# Patient Record
Sex: Female | Born: 1955 | Race: White | Hispanic: No | Marital: Married | Smoking: Never smoker
Health system: Southern US, Community
[De-identification: ages and names within clinical notes are randomized; demographics above are authoritative.]

## PROBLEM LIST (undated history)

## (undated) DIAGNOSIS — N3281 Overactive bladder: Secondary | ICD-10-CM

## (undated) DIAGNOSIS — F419 Anxiety disorder, unspecified: Secondary | ICD-10-CM

## (undated) DIAGNOSIS — N8110 Cystocele, unspecified: Secondary | ICD-10-CM

## (undated) DIAGNOSIS — Z960 Presence of urogenital implants: Secondary | ICD-10-CM

## (undated) DIAGNOSIS — R0989 Other specified symptoms and signs involving the circulatory and respiratory systems: Secondary | ICD-10-CM

## (undated) DIAGNOSIS — G479 Sleep disorder, unspecified: Secondary | ICD-10-CM

## (undated) DIAGNOSIS — F32A Depression, unspecified: Secondary | ICD-10-CM

## (undated) DIAGNOSIS — K219 Gastro-esophageal reflux disease without esophagitis: Secondary | ICD-10-CM

## (undated) HISTORY — PX: BUNIONECTOMY: SHX129

## (undated) HISTORY — DX: Cystocele, unspecified: N81.10

## (undated) HISTORY — DX: Overactive bladder: N32.81

## (undated) HISTORY — PX: CHOLECYSTECTOMY: SHX55

## (undated) HISTORY — DX: Gastro-esophageal reflux disease without esophagitis: K21.9

## (undated) HISTORY — DX: Presence of urogenital implants: Z96.0

## (undated) HISTORY — DX: Sleep disorder, unspecified: G47.9

## (undated) HISTORY — DX: Depression, unspecified: F32.A

## (undated) HISTORY — DX: Other specified symptoms and signs involving the circulatory and respiratory systems: R09.89

## (undated) HISTORY — DX: Anxiety disorder, unspecified: F41.9

---

## 1998-11-24 ENCOUNTER — Other Ambulatory Visit: Admission: RE | Admit: 1998-11-24 | Discharge: 1998-11-24 | Payer: Self-pay | Admitting: Family Medicine

## 1999-10-05 ENCOUNTER — Encounter: Payer: Self-pay | Admitting: Family Medicine

## 1999-10-05 ENCOUNTER — Encounter: Admission: RE | Admit: 1999-10-05 | Discharge: 1999-10-05 | Payer: Self-pay | Admitting: Family Medicine

## 2000-03-14 ENCOUNTER — Other Ambulatory Visit: Admission: RE | Admit: 2000-03-14 | Discharge: 2000-03-14 | Payer: Self-pay | Admitting: Family Medicine

## 2000-04-11 ENCOUNTER — Encounter: Payer: Self-pay | Admitting: Family Medicine

## 2000-04-11 ENCOUNTER — Encounter: Admission: RE | Admit: 2000-04-11 | Discharge: 2000-04-11 | Payer: Self-pay | Admitting: Family Medicine

## 2000-08-18 ENCOUNTER — Other Ambulatory Visit: Admission: RE | Admit: 2000-08-18 | Discharge: 2000-08-18 | Payer: Self-pay | Admitting: Family Medicine

## 2001-05-15 ENCOUNTER — Encounter: Admission: RE | Admit: 2001-05-15 | Discharge: 2001-05-15 | Payer: Self-pay | Admitting: Family Medicine

## 2001-05-15 ENCOUNTER — Encounter: Payer: Self-pay | Admitting: Family Medicine

## 2002-05-24 ENCOUNTER — Encounter: Admission: RE | Admit: 2002-05-24 | Discharge: 2002-05-24 | Payer: Self-pay | Admitting: Family Medicine

## 2002-05-24 ENCOUNTER — Encounter: Payer: Self-pay | Admitting: Family Medicine

## 2002-05-24 ENCOUNTER — Other Ambulatory Visit: Admission: RE | Admit: 2002-05-24 | Discharge: 2002-05-24 | Payer: Self-pay | Admitting: Family Medicine

## 2003-06-03 ENCOUNTER — Encounter: Admission: RE | Admit: 2003-06-03 | Discharge: 2003-06-03 | Payer: Self-pay | Admitting: Family Medicine

## 2003-06-03 ENCOUNTER — Encounter: Payer: Self-pay | Admitting: Family Medicine

## 2003-06-24 ENCOUNTER — Other Ambulatory Visit: Admission: RE | Admit: 2003-06-24 | Discharge: 2003-06-24 | Payer: Self-pay | Admitting: Family Medicine

## 2004-07-16 ENCOUNTER — Ambulatory Visit: Payer: Self-pay | Admitting: Family Medicine

## 2004-07-16 ENCOUNTER — Other Ambulatory Visit: Admission: RE | Admit: 2004-07-16 | Discharge: 2004-07-16 | Payer: Self-pay | Admitting: Family Medicine

## 2004-08-13 ENCOUNTER — Encounter: Admission: RE | Admit: 2004-08-13 | Discharge: 2004-08-13 | Payer: Self-pay | Admitting: Family Medicine

## 2005-07-17 ENCOUNTER — Other Ambulatory Visit: Admission: RE | Admit: 2005-07-17 | Discharge: 2005-07-17 | Payer: Self-pay | Admitting: Family Medicine

## 2005-07-17 ENCOUNTER — Encounter: Payer: Self-pay | Admitting: Family Medicine

## 2005-07-17 ENCOUNTER — Ambulatory Visit: Payer: Self-pay | Admitting: Family Medicine

## 2005-09-02 ENCOUNTER — Encounter: Admission: RE | Admit: 2005-09-02 | Discharge: 2005-09-02 | Payer: Self-pay | Admitting: Family Medicine

## 2006-10-03 ENCOUNTER — Other Ambulatory Visit: Admission: RE | Admit: 2006-10-03 | Discharge: 2006-10-03 | Payer: Self-pay | Admitting: Family Medicine

## 2006-10-03 ENCOUNTER — Encounter: Payer: Self-pay | Admitting: Family Medicine

## 2006-10-03 ENCOUNTER — Ambulatory Visit: Payer: Self-pay | Admitting: Family Medicine

## 2006-10-03 LAB — CONVERTED CEMR LAB
ALT: 12 units/L (ref 0–35)
AST: 16 units/L (ref 0–37)
Albumin: 4.4 g/dL (ref 3.5–5.2)
BUN: 13 mg/dL (ref 6–23)
Basophils Absolute: 0 10*3/uL (ref 0.0–0.1)
CO2: 26 meq/L (ref 19–32)
Calcium: 9.8 mg/dL (ref 8.4–10.5)
Eosinophils Absolute: 0.3 10*3/uL (ref 0.0–0.7)
HCT: 39.8 % (ref 36.0–46.0)
Hemoglobin: 13 g/dL (ref 12.0–15.0)
Lymphs Abs: 1.6 10*3/uL (ref 0.7–3.3)
MCV: 91.5 fL (ref 78.0–100.0)
Monocytes Relative: 7 % (ref 3–11)
Neutro Abs: 3.6 10*3/uL (ref 1.7–7.7)
Pap Smear: NORMAL
RBC: 4.35 M/uL (ref 3.87–5.11)
RDW: 13.9 % (ref 11.5–14.0)
WBC: 5.9 10*3/uL (ref 4.0–10.5)

## 2006-10-06 ENCOUNTER — Encounter: Admission: RE | Admit: 2006-10-06 | Discharge: 2006-10-06 | Payer: Self-pay | Admitting: Family Medicine

## 2007-09-02 ENCOUNTER — Telehealth: Payer: Self-pay | Admitting: Family Medicine

## 2007-09-27 DIAGNOSIS — R0989 Other specified symptoms and signs involving the circulatory and respiratory systems: Secondary | ICD-10-CM

## 2007-09-27 HISTORY — DX: Other specified symptoms and signs involving the circulatory and respiratory systems: R09.89

## 2007-10-05 ENCOUNTER — Encounter: Payer: Self-pay | Admitting: Family Medicine

## 2007-10-05 DIAGNOSIS — N318 Other neuromuscular dysfunction of bladder: Secondary | ICD-10-CM

## 2007-10-05 DIAGNOSIS — Z8619 Personal history of other infectious and parasitic diseases: Secondary | ICD-10-CM

## 2007-10-05 DIAGNOSIS — R61 Generalized hyperhidrosis: Secondary | ICD-10-CM

## 2007-10-05 DIAGNOSIS — N6019 Diffuse cystic mastopathy of unspecified breast: Secondary | ICD-10-CM

## 2007-10-05 DIAGNOSIS — K219 Gastro-esophageal reflux disease without esophagitis: Secondary | ICD-10-CM | POA: Insufficient documentation

## 2007-10-06 ENCOUNTER — Ambulatory Visit: Payer: Self-pay | Admitting: Family Medicine

## 2007-10-06 ENCOUNTER — Encounter: Payer: Self-pay | Admitting: Family Medicine

## 2007-10-06 ENCOUNTER — Other Ambulatory Visit: Admission: RE | Admit: 2007-10-06 | Discharge: 2007-10-06 | Payer: Self-pay | Admitting: Family Medicine

## 2007-10-06 DIAGNOSIS — R0989 Other specified symptoms and signs involving the circulatory and respiratory systems: Secondary | ICD-10-CM | POA: Insufficient documentation

## 2007-10-08 LAB — CONVERTED CEMR LAB
ALT: 12 units/L (ref 0–35)
AST: 16 units/L (ref 0–37)
Albumin: 4.6 g/dL (ref 3.5–5.2)
Alkaline Phosphatase: 57 units/L (ref 39–117)
BUN: 12 mg/dL (ref 6–23)
Basophils Absolute: 0 10*3/uL (ref 0.0–0.1)
Basophils Relative: 1 % (ref 0–1)
Bilirubin, Direct: 0.1 mg/dL (ref 0.0–0.3)
CO2: 24 meq/L (ref 19–32)
Calcium: 9.8 mg/dL (ref 8.4–10.5)
Chloride: 105 meq/L (ref 96–112)
Cholesterol: 190 mg/dL (ref 0–200)
Creatinine, Ser: 0.95 mg/dL (ref 0.40–1.20)
Eosinophils Absolute: 0.2 10*3/uL (ref 0.0–0.7)
Eosinophils Relative: 3 % (ref 0–5)
Glucose, Bld: 97 mg/dL (ref 70–99)
HCT: 39.4 % (ref 36.0–46.0)
HDL: 62 mg/dL (ref >39)
Hemoglobin: 13.2 g/dL (ref 12.0–15.0)
Indirect Bilirubin: 0.5 mg/dL (ref 0.0–0.9)
LDL Cholesterol: 113 mg/dL — ABNORMAL HIGH (ref 0–99)
Lymphocytes Relative: 30 % (ref 12–46)
Lymphs Abs: 1.3 10*3/uL (ref 0.7–4.0)
MCHC: 33.5 g/dL (ref 30.0–36.0)
MCV: 90.4 fL (ref 78.0–100.0)
Monocytes Absolute: 0.4 10*3/uL (ref 0.1–1.0)
Monocytes Relative: 8 % (ref 3–12)
Neutro Abs: 2.5 10*3/uL (ref 1.7–7.7)
Neutrophils Relative %: 58 % (ref 43–77)
Platelets: 267 10*3/uL (ref 150–400)
Potassium: 5.1 meq/L (ref 3.5–5.3)
RBC: 4.36 M/uL (ref 3.87–5.11)
RDW: 13 % (ref 11.5–15.5)
Sodium: 139 meq/L (ref 135–145)
TSH: 2.211 microintl units/mL (ref 0.350–5.50)
Total Bilirubin: 0.6 mg/dL (ref 0.3–1.2)
Total CHOL/HDL Ratio: 3.1
Total Protein: 7.3 g/dL (ref 6.0–8.3)
Triglycerides: 73 mg/dL (ref <150)
VLDL: 15 mg/dL (ref 0–40)
WBC: 4.4 10*3/uL (ref 4.0–10.5)

## 2007-10-12 ENCOUNTER — Encounter (INDEPENDENT_AMBULATORY_CARE_PROVIDER_SITE_OTHER): Payer: Self-pay | Admitting: *Deleted

## 2007-10-12 LAB — CONVERTED CEMR LAB: Pap Smear: NORMAL

## 2007-10-13 ENCOUNTER — Ambulatory Visit: Payer: Self-pay

## 2007-10-13 ENCOUNTER — Encounter: Admission: RE | Admit: 2007-10-13 | Discharge: 2007-10-13 | Payer: Self-pay | Admitting: Family Medicine

## 2007-10-13 ENCOUNTER — Encounter: Payer: Self-pay | Admitting: Family Medicine

## 2007-10-15 ENCOUNTER — Ambulatory Visit: Payer: Self-pay | Admitting: Family Medicine

## 2007-10-19 ENCOUNTER — Encounter (INDEPENDENT_AMBULATORY_CARE_PROVIDER_SITE_OTHER): Payer: Self-pay | Admitting: *Deleted

## 2007-12-25 ENCOUNTER — Ambulatory Visit: Payer: Self-pay | Admitting: Family Medicine

## 2007-12-25 DIAGNOSIS — G479 Sleep disorder, unspecified: Secondary | ICD-10-CM | POA: Insufficient documentation

## 2007-12-25 LAB — CONVERTED CEMR LAB
Bilirubin Urine: NEGATIVE
Glucose, Urine, Semiquant: NEGATIVE
Ketones, urine, test strip: NEGATIVE
Nitrite: NEGATIVE
Specific Gravity, Urine: 1.025
Urobilinogen, UA: 0.2

## 2008-01-06 ENCOUNTER — Ambulatory Visit: Payer: Self-pay | Admitting: Family Medicine

## 2008-01-06 ENCOUNTER — Encounter: Payer: Self-pay | Admitting: Family Medicine

## 2008-04-26 ENCOUNTER — Ambulatory Visit: Payer: Self-pay | Admitting: Obstetrics & Gynecology

## 2008-05-16 ENCOUNTER — Ambulatory Visit: Payer: Self-pay | Admitting: Obstetrics & Gynecology

## 2008-05-20 ENCOUNTER — Telehealth: Payer: Self-pay | Admitting: Family Medicine

## 2008-10-25 ENCOUNTER — Encounter: Admission: RE | Admit: 2008-10-25 | Discharge: 2008-10-25 | Payer: Self-pay | Admitting: Family Medicine

## 2008-10-27 ENCOUNTER — Encounter (INDEPENDENT_AMBULATORY_CARE_PROVIDER_SITE_OTHER): Payer: Self-pay | Admitting: *Deleted

## 2008-11-15 ENCOUNTER — Ambulatory Visit: Payer: Self-pay | Admitting: Family Medicine

## 2008-11-15 DIAGNOSIS — N951 Menopausal and female climacteric states: Secondary | ICD-10-CM

## 2008-11-15 DIAGNOSIS — N814 Uterovaginal prolapse, unspecified: Secondary | ICD-10-CM | POA: Insufficient documentation

## 2008-11-17 LAB — CONVERTED CEMR LAB
AST: 18 units/L (ref 0–37)
Alkaline Phosphatase: 79 units/L (ref 39–117)
CO2: 29 meq/L (ref 19–32)
Calcium: 10 mg/dL (ref 8.4–10.5)
Glucose, Bld: 97 mg/dL (ref 70–99)
HDL: 59 mg/dL (ref >39)
Hemoglobin: 14.2 g/dL (ref 12.0–15.0)
LDL Cholesterol: 138 mg/dL — ABNORMAL HIGH (ref 0–99)
MCHC: 34.5 g/dL (ref 30.0–36.0)
Monocytes Relative: 9 % (ref 3–12)
Neutrophils Relative %: 53 % (ref 43–77)
Platelets: 249 10*3/uL (ref 150–400)
Potassium: 4.9 meq/L (ref 3.5–5.3)
RDW: 13 % (ref 11.5–15.5)
Sodium: 141 meq/L (ref 135–145)
Total CHOL/HDL Ratio: 3.6
Total Protein: 7.6 g/dL (ref 6.0–8.3)
Triglycerides: 90 mg/dL (ref <150)

## 2008-12-07 ENCOUNTER — Ambulatory Visit: Payer: Self-pay | Admitting: Family Medicine

## 2008-12-07 LAB — CONVERTED CEMR LAB: OCCULT 1: NEGATIVE

## 2008-12-08 ENCOUNTER — Encounter (INDEPENDENT_AMBULATORY_CARE_PROVIDER_SITE_OTHER): Payer: Self-pay | Admitting: *Deleted

## 2009-02-13 ENCOUNTER — Telehealth: Payer: Self-pay | Admitting: Family Medicine

## 2009-04-17 ENCOUNTER — Telehealth: Payer: Self-pay | Admitting: Family Medicine

## 2009-05-11 ENCOUNTER — Ambulatory Visit: Payer: Self-pay | Admitting: Family Medicine

## 2009-05-12 LAB — CONVERTED CEMR LAB
LDL Cholesterol: 132 mg/dL — ABNORMAL HIGH (ref 0–99)
Total CHOL/HDL Ratio: 4

## 2009-07-11 ENCOUNTER — Telehealth: Payer: Self-pay | Admitting: Family Medicine

## 2009-11-07 ENCOUNTER — Encounter: Admission: RE | Admit: 2009-11-07 | Discharge: 2009-11-07 | Payer: Self-pay | Admitting: Family Medicine

## 2009-11-07 LAB — HM MAMMOGRAPHY

## 2009-11-09 ENCOUNTER — Encounter: Payer: Self-pay | Admitting: Family Medicine

## 2009-11-20 ENCOUNTER — Ambulatory Visit: Payer: Self-pay | Admitting: Family Medicine

## 2009-11-20 ENCOUNTER — Telehealth: Payer: Self-pay | Admitting: Family Medicine

## 2009-11-22 LAB — CONVERTED CEMR LAB
Albumin: 4.7 g/dL (ref 3.5–5.2)
CO2: 27 meq/L (ref 19–32)
Eosinophils Relative: 5 % (ref 0–5)
HCT: 40.8 % (ref 36.0–46.0)
Indirect Bilirubin: 0.6 mg/dL (ref 0.0–0.9)
LDL Cholesterol: 152 mg/dL — ABNORMAL HIGH (ref 0–99)
Lymphocytes Relative: 35 % (ref 12–46)
Lymphs Abs: 1.5 10*3/uL (ref 0.7–4.0)
MCHC: 33.8 g/dL (ref 30.0–36.0)
MCV: 88.3 fL (ref 78.0–100.0)
Monocytes Absolute: 0.3 10*3/uL (ref 0.1–1.0)
Neutro Abs: 2.3 10*3/uL (ref 1.7–7.7)
Neutrophils Relative %: 53 % (ref 43–77)
Platelets: 234 10*3/uL (ref 150–400)
Potassium: 4.3 meq/L (ref 3.5–5.3)
RBC: 4.62 M/uL (ref 3.87–5.11)
Sodium: 140 meq/L (ref 135–145)
TSH: 2.008 microintl units/mL (ref 0.350–4.500)
Total Bilirubin: 0.7 mg/dL (ref 0.3–1.2)
Total Protein: 7.4 g/dL (ref 6.0–8.3)
VLDL: 15 mg/dL (ref 0–40)

## 2009-11-24 ENCOUNTER — Telehealth: Payer: Self-pay | Admitting: Family Medicine

## 2009-11-29 ENCOUNTER — Ambulatory Visit: Payer: Self-pay | Admitting: Family Medicine

## 2009-11-30 ENCOUNTER — Encounter (INDEPENDENT_AMBULATORY_CARE_PROVIDER_SITE_OTHER): Payer: Self-pay | Admitting: *Deleted

## 2009-11-30 LAB — CONVERTED CEMR LAB: Fecal Occult Bld: NEGATIVE

## 2009-12-18 ENCOUNTER — Telehealth: Payer: Self-pay | Admitting: Family Medicine

## 2009-12-19 ENCOUNTER — Ambulatory Visit: Payer: Self-pay | Admitting: Obstetrics & Gynecology

## 2010-01-30 ENCOUNTER — Ambulatory Visit: Payer: Self-pay | Admitting: Family Medicine

## 2010-01-30 DIAGNOSIS — E7800 Pure hypercholesterolemia, unspecified: Secondary | ICD-10-CM | POA: Insufficient documentation

## 2010-02-01 LAB — CONVERTED CEMR LAB
ALT: 11 units/L (ref 0–35)
AST: 17 units/L (ref 0–37)
HDL: 53 mg/dL (ref >39)
LDL Cholesterol: 107 mg/dL — ABNORMAL HIGH (ref 0–99)
Triglycerides: 94 mg/dL (ref <150)
VLDL: 19 mg/dL (ref 0–40)

## 2010-02-09 ENCOUNTER — Telehealth: Payer: Self-pay | Admitting: Family Medicine

## 2010-02-12 ENCOUNTER — Telehealth: Payer: Self-pay | Admitting: Family Medicine

## 2010-04-12 ENCOUNTER — Telehealth: Payer: Self-pay | Admitting: Family Medicine

## 2010-07-06 ENCOUNTER — Ambulatory Visit: Payer: Self-pay | Admitting: Family Medicine

## 2010-07-06 DIAGNOSIS — R519 Headache, unspecified: Secondary | ICD-10-CM | POA: Insufficient documentation

## 2010-07-06 DIAGNOSIS — R51 Headache: Secondary | ICD-10-CM | POA: Insufficient documentation

## 2010-07-11 ENCOUNTER — Telehealth: Payer: Self-pay | Admitting: Family Medicine

## 2010-09-25 NOTE — Progress Notes (Signed)
Summary: pt wants ambien  Phone Note Refill Request Call back at [facility] Phone [phone] Message from:  Patient  Refills Requested: Medication #1:  ambien 10 mg Pt has been trying different sleep meds.  She has most recently been on lunesta, but she doesnt like this- gives her strange dreams.  She wants to go back to generic ambien.  [facility]  Initial call taken by: Lowella Petties CMA,  February 09, 2010 9:01 AM

## 2010-09-25 NOTE — Progress Notes (Signed)
Summary: regarding ambien  Phone Note Call from Patient   Caller: Patient Call For: Dr. Dayton Martes Summary of Call: Pt had called on friday requesting ambien.  I must have signed off on the phone note instead of sending it out.  She has been trying different sleep meds and wants to go back to generic ambien.  The lunesta, which she had last, gave her strange dreams.  [facility] [address]  [phone]. Initial call taken by: Lowella Petties CMA,  February 12, 2010 9:30 AM  Follow-up for Phone Call        [facility] Follow-up by: Lowella Petties CMA,  February 13, 2010 1:59 PM    New/Updated Medications: ZOLPIDEM TARTRATE 10 MG TABS (ZOLPIDEM TARTRATE) one tab by mouth at night if needed for insomnia Prescriptions: ZOLPIDEM TARTRATE 10 MG TABS (ZOLPIDEM TARTRATE) one tab by mouth at night if needed for insomnia  #30 x 0   Entered and Authorized by:   Shaune Leeks MD   Signed by:   Shaune Leeks MD on 02/13/2010   Method used:   Telephoned to ...       [facility] [phone]* (retail)       [address] [place], [place]  [address]       Ph: [phone] or [phone]       Fax: [phone]   RxID:   [id number]

## 2010-09-25 NOTE — Letter (Signed)
Summary: Results Follow up Letter  [place] at [facility]  [address] [place], [place] [address]   Phone: [phone]  Fax: [phone]    11/09/2009 MRN: [id number]    LYNDALL BELLOT [address] [place], [place]  [address]    Dear [name],  The following are the results of your recent test(s):  Test         Result    Pap Smear:        Normal _____  Not Normal _____ Comments: ______________________________________________________ Cholesterol: LDL(Bad cholesterol):         Your goal is less than:         HDL (Good cholesterol):       Your goal is more than: Comments:  ______________________________________________________ Mammogram:        Normal __X___  Not Normal _____ Comments:Please repeat in one year.  ___________________________________________________________________ Hemoccult:        Normal _____  Not normal _______ Comments:    _____________________________________________________________________ Other Tests:    We routinely do not discuss normal results over the telephone.  If you desire a copy of the results, or you have any questions about this information we can discuss them at your next office visit.   Sincerely,     Roxy Manns, MD  MT/ri

## 2010-09-25 NOTE — Assessment & Plan Note (Signed)
Summary: CPX/CLE   Vital Signs:  Patient profile:   55 year old female Height:      61.5 inches Weight:      121.50 pounds BMI:     22.67 Temp:     98.5 degrees F oral Pulse rate:   68 / minute Pulse rhythm:   regular BP sitting:   108 / 70  (left arm) Cuff size:   regular  Vitals Entered By: Lewanda Rife LPN (November 20, 2009 9:29 AM) CC: complete physical (appt with GYN in April)   History of Present Illness: here for wellness exam and to disc chronic problems   is doing ok in general  a lot of changes in her body with menopause   is still having trouble sleeping -- takes [place] but not every night - takes it only if needed  4-5 hours of sleep then  this affects her daytime performance    wt is down 4 lb with good bmi of 22  is really working with her diet -- and proud of this  is also exercising (took a break for foot surgery) walks on the treadmill 4 times per week    bp 108/70 today   gyn appt upcoming in april  has a pessary  mam nl 3/11 self exam - no lumps or changes    colon screen-- is not interested in one . would rather do stool test  may consider colonosc at 56   lipids in fall Last Lipid ProfileCholesterol: 196 (05/11/2009 10:50:44 AM)HDL:  49.60 (05/11/2009 10:50:44 AM)LDL:  132 (05/11/2009 10:50:44 AM)Triglycerides:  Last Liver profileSGOT:  18 (05/11/2009 10:50:44 AM)SPGT:  14 (05/11/2009 10:50:44 AM)T. Bili:  0.8 (11/15/2008 9:09:00 PM)Alk Phos:  79 (11/15/2008 9:09:00 PM)  DIET IS GOOD-- thinks this will have imp  tD 06    Allergies: 1)  ! Sulfa 2)  ! 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 Eyes:  Denies blurring and eye irritation. CV:  Denies chest pain or discomfort, palpitations, and shortness of breath with exertion. Resp:  Denies cough and wheezing. GI:  Denies abdominal pain, bloody stools, change in bowel habits, indigestion, and nausea. GU:  Denies abnormal vaginal bleeding, discharge, and dysuria. MS:  Denies joint redness, joint swelling, and cramps. Derm:  Denies itching, lesion(s), poor wound healing, and rash. Neuro:  Denies headaches, numbness, and tingling. Psych:  mood is ok . Endo:  Denies cold intolerance, excessive thirst, excessive urination, and heat intolerance. Heme:  Denies abnormal bruising and bleeding.  Physical Exam  General:  Well-developed,well-nourished,in no acute distress; alert,appropriate and cooperative throughout examination Head:  normocephalic, atraumatic, and no abnormalities observed.   Eyes:  vision grossly intact, pupils equal, pupils round, and pupils reactive to light.  no conjunctival pallor, injection or icterus  Ears:  R ear normal and L ear normal.   Nose:  no nasal discharge.   Mouth:  pharynx pink and moist.   Neck:  baseline L carotid  bruit supple, no M or thyromegally Chest Wall:  No deformities, masses, or tenderness noted. Lungs:  Normal respiratory effort, chest expands symmetrically. Lungs are clear to auscultation, no crackles or wheezes. Heart:  Normal rate and regular rhythm. S1 and S2 normal without gallop, murmur, click, rub or other extra sounds. Abdomen:  Bowel sounds positive,abdomen soft and non-tender without masses, organomegaly or hernias noted. no renal bruits  Msk:  No deformity or scoliosis noted of thoracic or lumbar spine.   no acute joint changes  Pulses:  R and L carotid,radial,femoral,dorsalis pedis and posterior tibial pulses are full and equal bilaterally Extremities:  No clubbing, cyanosis, edema, or deformity noted with normal full range of motion of all joints.   Neurologic:  sensation intact to light touch, gait normal, and DTRs symmetrical and normal.   Skin:  Intact without suspicious lesions or rashes few small SK on back and lentigos Cervical Nodes:  No lymphadenopathy noted Inguinal Nodes:  No significant adenopathy Psych:  normal affect, talkative and pleasant    Impression & Recommendations:  Problem # 1:  HEALTH MAINTENANCE EXAM (ICD-V70.0) Assessment Comment Only reviewed health habits including diet, exercise and skin cancer prevention reviewed health maintenance list and family history lab today  suspect cholesterol will be imp with better diet  Td up to date stool card for screen - declines colonosc  f/u gyn as planned  Orders: Venipuncture (29798) Specimen Handling (92119)  Problem # 2:  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 Daily per gyn 2)  Vagifem 10 Mcg Tabs (Estradiol) .... One tab vaginally twice weekly 3)  Multivitamins Tabs (Multiple vitamin) .... Take 1 tablet by mouth once a day 4)  Viactiv Multi-vitamin Chew (Multiple vitamins-calcium) .... Chew two daily 5)  Fish Oil Oil (Fish oil) .... Takes 1000mg  daily 6)  Ambien Cr 6.25 Mg Cr-tabs (Zolpidem tartrate) .Marland Kitchen.. 1 by mouth once daily  Patient Instructions: 1)  keep up the good work with diet and exercise  2)  labs today  3)  try the ambien cr and let me know how it works and  what you want to refil in month  4)  please do stool test  5)  the current recommendation for calcium intake is 1200-1500 mg daily with 1000 IU of vitamin D  Prescriptions: AMBIEN CR 6.25 MG CR-TABS (ZOLPIDEM TARTRATE) 1 by mouth once daily  #30 x 0   Entered and Authorized by:   Judith Part MD   Signed by:   Judith Part MD on 11/20/2009   Method used:   Print then Give to Patient   RxID:   [id number]   Current Allergies (reviewed today): ! SULFA ! CODEINE

## 2010-09-25 NOTE — Letter (Signed)
Summary: Results Follow up Letter  [place] at [facility]  [address] [place], [place] [address]   Phone: [phone]  Fax: [phone]    11/30/2009 MRN: [id number]    Katherine Macdonald [address] [place], [place]  [address]    Dear [name],  The following are the results of your recent test(s):  Test         Result    Pap Smear:        Normal _____  Not Normal _____ Comments: ______________________________________________________ Cholesterol: LDL(Bad cholesterol):         Your goal is less than:         HDL (Good cholesterol):       Your goal is more than: Comments:  ______________________________________________________ Mammogram:        Normal _____  Not Normal _____ Comments:  ___________________________________________________________________ Hemoccult:        Normal __X___  Not normal _______ Comments:    _____________________________________________________________________ Other Tests:    We routinely do not discuss normal results over the telephone.  If you desire a copy of the results, or you have any questions about this information we can discuss them at your next office visit.   Sincerely,    Marne A. Milinda Antis, M.D.  MAT:lsf

## 2010-09-25 NOTE — Miscellaneous (Signed)
Summary: mammogram screening   Clinical Lists Changes  Observations: Added new observation of MAMMO DUE: 10/2010 (11/09/2009 15:31) Added new observation of MAMMOGRAM: normal (11/07/2009 15:31)      Preventive Care Screening  Mammogram:    Date:  11/07/2009    Next Due:  10/2010    Results:  normal

## 2010-09-25 NOTE — Progress Notes (Signed)
Summary: silenor is not helping sleep  Phone Note Call from Patient Call back at [phone]   Caller: Patient Call For: Judith Part MD Summary of Call: Pt was seen last week for headaches thought to be caused by ambien.  She stopped ambien and started silenor.  The headaches stopped, she hasnt had to take anything for those since saturday,  but the silenor is not working, she is not sleeping.  What else can she try?  [facility] Initial call taken by: Lowella Petties CMA, AAMA,  July 11, 2010 9:59 AM  Follow-up for Phone Call        I hate to give her anything habit forming - but I think that is our last resort lets try low dose of klonopin - let her know to use caution for sedation and this is habit forming - also do not loose px/ etc  this will generally help people stay asleep let me know how it works Follow-up by: Judith Part MD,  July 11, 2010 11:29 AM  Additional Follow-up for Phone Call Additional follow up Details #1::        lEFT MESSAGE FOR PATIENT BUT, RX ALREADY CALLED TO PHARMACY.Consuello Masse CMA   Additional Follow-up by: Benny Lennert CMA Duncan Dull),  July 11, 2010 11:58 AM   New Allergies: Rush [place] Additional Follow-up for Phone Call Additional follow up Details #2::    Advised pt[facility] Follow-up by: Lowella Petties CMA, AAMA,  July 11, 2010 12:08 PM  New/Updated Medications: KLONOPIN 0.5 MG TABS (CLONAZEPAM) 1 by mouth at bedtime as needed insomnia New Allergies: * SILENOR0Prescriptions: KLONOPIN 0.5 MG TABS (CLONAZEPAM) 1 by mouth at bedtime as needed insomnia  #30 x 1   Entered and Authorized by:   Judith Part MD   Signed by:   Lowella Petties CMA, AAMA on 07/11/2010   Method used:   Telephoned to ...       [facility] [phone]* (retail)       [address] [place], [place]  [address]       Ph: [phone] or [phone]       Fax: [phone]   RxID:    [id number]

## 2010-09-25 NOTE — Progress Notes (Signed)
Summary: refill request for ambien  Phone Note Refill Request Message from:  Fax from Pharmacy  Refills Requested: Medication #1:  ZOLPIDEM TARTRATE 10 MG TABS one tab by mouth at night if needed for insomnia.   Last Refilled: 02/13/2010 [facility] [address], [phone]  Initial call taken by: Lowella Petties CMA,  April 12, 2010 8:39 AM  Follow-up for Phone Call        px written on EMR for call in  Follow-up by: Judith Part MD,  April 12, 2010 8:48 AM  Additional Follow-up for Phone Call Additional follow up Details #1::        [facility] as instructed. Lewanda Rife LPN  April 12, 2010 9:01 AM     Prescriptions: ZOLPIDEM TARTRATE 10 MG TABS (ZOLPIDEM TARTRATE) one tab by mouth at night if needed for insomnia  #30 x 2   Entered and Authorized by:   Judith Part MD   Signed by:   Lewanda Rife LPN on 16/05/9603   Method used:   Telephoned to ...       [facility] [phone]* (retail)       [address] [place], [place]  [address]       Ph: [phone] or [phone]       Fax: [phone]   RxID:   [id number]

## 2010-09-25 NOTE — Assessment & Plan Note (Signed)
Summary: FREQUENT HEADACHES/CLE   Vital Signs:  Patient profile:   55 year old female Height:      61.5 inches Weight:      117.50 pounds BMI:     21.92 Temp:     98.4 degrees F oral Pulse rate:   64 / minute Pulse rhythm:   regular BP sitting:   112 / 72  (left arm) Cuff size:   regular  Vitals Entered By: Lewanda Rife LPN (July 06, 2010 9:00 AM) CC: frequent h/as   History of Present Illness: having headaches more frequent than normal  over the last 5-6 weeks -- having headaches 3-4 times per week  fairly bad but not activity limiting   no light or sound sensitivity  went for massage -- because neck hurts too -- that did not help   took ibuprofen -- initially helped and that did not work  excedrin migraine helped initially   wondered about [place] -- ? if worse when she takes or does not take  tends to take 1/2 tab 5-6 times per week   headaches are bilateral not throbbing  not worse with exertion  waves of nausea - not severe  worst on pain scale is 7-8   is drinking a lot of water  drinks coffee in am and tea  at lunch   is right in middle of menopause   Allergies: 1)  ! Sulfa 2)  ! 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 Eyes:  Denies blurring and eye irritation. CV:  Denies chest pain or discomfort, lightheadness, palpitations, and  shortness of breath with exertion. Resp:  Denies cough, shortness of breath, and wheezing. GI:  Denies abdominal pain, change in bowel habits, indigestion, nausea, and vomiting. GU:  Denies urinary frequency. MS:  Complains of stiffness and thoracic pain; neck is sore and tight at times - esp when stressed . Derm:  Denies lesion(s), poor wound healing, and rash. Neuro:  Complains of headaches; denies difficulty with concentration, disturbances in coordination, falling down, numbness, tingling, visual disturbances, and weakness. Psych:  Denies anxiety and depression. Endo:  Denies cold intolerance, excessive thirst, excessive urination, and heat intolerance. Heme:  Denies abnormal bruising and bleeding.  Physical Exam  General:  slim and well appearing  Head:  normocephalic, atraumatic, and no abnormalities observed.   Eyes:  vision grossly intact, pupils equal, pupils round, and pupils reactive to light.  no conjunctival pallor, injection or icterus  Mouth:  pharynx pink and moist.   Neck:  soft bruit on L  otherwise nl  nl rom, no thyromeg or jvd Chest Wall:  No deformities, masses, or tenderness noted. Lungs:  Normal respiratory effort, chest expands symmetrically. Lungs are clear to auscultation, no crackles or wheezes. Heart:  Normal rate and regular rhythm. S1 and S2 normal without gallop, murmur, click, rub or other extra sounds. Abdomen:  Bowel sounds positive,abdomen soft and non-tender without masses, organomegaly or hernias noted. Msk:  generally tight neck muscles with nl rom and no bony tenderness Extremities:  No clubbing, cyanosis, edema, or deformity noted with normal full range of motion of all joints.   Neurologic:  alert & oriented X3, cranial nerves II-XII intact, strength normal in all extremities, sensation intact to light touch, gait normal, DTRs symmetrical and normal, toes down bilaterally on Babinski, and Romberg negative.   Skin:  Intact without suspicious lesions  or rashes Cervical Nodes:  No lymphadenopathy noted Inguinal Nodes:  No significant adenopathy Psych:  normal affect, talkative and pleasant    Impression & Recommendations:  Problem # 1:  HEADACHE (ICD-784.0) Assessment New ongoing - almost daily no migraine features suspect may be side eff from [place] will try 6 mg silenor for sleep- samples disc lifestyle habits/ sleep hygiene in detail  bed and wake time same each day and good fluids/ avoid caff disc phenomenon of analgesic rebound  will use ibuprofen sparingly   Problem # 2:  SLEEP DISORDER (ICD-780.50) Assessment: Unchanged see above   Complete Medication List: 1)  Pessary  .... Daily per gyn 2)  Vagifem 10 Mcg Tabs (Estradiol) .... One tab vaginally twice weekly 3)  Multivitamins Tabs (Multiple vitamin) .... Take 1 tablet by mouth once a day 4)  Viactiv Multi-vitamin Chew (Multiple vitamins-calcium) .... Chew two daily 5)  Flax Oil (Flaxseed (linseed)) .... Two tsp on food four times a week 6)  Silenor 6 Mg Tabs (Doxepin hcl) .Marland Kitchen.. 1 by mouth at bedtime as needed  Patient Instructions: 1)  try the silenor for sleep 6 mg at bedtime 2)  let me know if your headaches do not improve in 1-2 wk or if this does not help 3)  take ibupfrofen if you need to    Orders Added: 1)  Est. Patient Level IV [94854]    Current Allergies (reviewed today): ! SULFA ! CODEINE * AMBIEN

## 2010-09-25 NOTE — Progress Notes (Signed)
Summary: Lunesta   Phone Note Call from Patient Call back at Home Phone [phone]   Caller: Patient Call For: Judith Part MD Summary of Call: Patient was given rx for ambien at her cpx and it was denied.( I saw phone note regarding that) Patient wants to know if she needs to try Lunesta or what she needs to do. [facility] [address] Please advise. Initial call taken by: Melody Comas,  December 18, 2009 2:52 PM  Follow-up for Phone Call        px written on EMR for call in  Follow-up by: Judith Part MD,  December 18, 2009 3:58 PM  Additional Follow-up for Phone Call Additional follow up Details #1::        Patient notified as instructed by telephone. [facility] as instructed. Lewanda Rife LPN  December 18, 2009 4:54 PM     New/Updated Medications: LUNESTA 2 MG TABS (ESZOPICLONE) 1 by mouth at bedtime as needed Prescriptions: LUNESTA 2 MG TABS (ESZOPICLONE) 1 by mouth at bedtime as needed  #30 x 1   Entered and Authorized by:   Judith Part MD   Signed by:   Lewanda Rife LPN on 09/81/1914   Method used:   Telephoned to ...         RxID:   [id number]

## 2010-09-25 NOTE — Letter (Signed)
Summary: [facility]: Immunoassay Fecal Occult Blood (iFOB) Order Form  [place] at [facility]  [address] [place], [place] [address]   Phone: [phone]  Fax: [phone]      [facility]: Immunoassay Fecal Occult Blood (iFOB) Order Form   November 20, 2009 MRN: [id number]   Katherine Macdonald 25-Oct-1955   Physicican Name:______Tower___________________  Diagnosis Code:_______V76.49___________________      Judith Part MD

## 2010-09-25 NOTE — Progress Notes (Signed)
Summary: prior auth for [employer] cr denied  Phone Note Other Incoming   Caller: [employer] of Call: Prior auth for [employer] cr denied, pt needs to try lunesta.  Letter is on your shelf. Initial call taken by: Lowella Petties CMA,  November 24, 2009 3:10 PM  Follow-up for Phone Call        I did fail Remus Loffler / zolpidem -- I indicated this on her letter - please fax back and see what they say if still denied - will try the lunesta  Follow-up by: Judith Part MD,  November 24, 2009 3:44 PM  Additional Follow-up for Phone Call Additional follow up Details #1::        Info faxed as requested on 11/24/09 to [phone]. Lewanda Rife LPN  November 27, 1025 8:23 AM

## 2010-09-25 NOTE — Progress Notes (Signed)
Summary: Prior Authorization Zolpidem  Phone Note From Pharmacy Call back at ph [phone] fax [phone]   [facility]  Call For: Dr. Milinda Antis  Summary of Call: Received fax from pharmacy stating that PA is needed for Zolpidem.  Called [facility] at [phone] and they will fax form to our office today.  Linde Gillis CMA Duncan Dull)  November 20, 2009 3:13 PM   PA form in your IN box. Initial call taken by: Linde Gillis CMA Duncan Dull),  November 20, 2009 4:46 PM  Follow-up for Phone Call        her ins co wants to know what other meds she has tried for sleep that have not worked -- I need to list them on her form  let me know- I will hold form Follow-up by: Judith Part MD,  November 20, 2009 5:24 PM  Additional Follow-up for Phone Call Additional follow up Details #1::        Left message for patient to call back. Lewanda Rife LPN  November 21, 2009 9:30 AM  Pt has tried plain ambien 10 mg's and melatonin.  Also some OTC sleep   thanks form done and in nurse in box  - MT  Additional Follow-up by: Lowella Petties CMA,  November 21, 2009 12:20 PM    Additional Follow-up for Phone Call Additional follow up Details #2::    Form faxed to [facility] [phone] Follow-up by: Linde Gillis CMA Duncan Dull),  November 21, 2009 2:38 PM

## 2010-10-02 ENCOUNTER — Telehealth: Payer: Self-pay | Admitting: Family Medicine

## 2010-10-04 ENCOUNTER — Encounter: Payer: Self-pay | Admitting: Family Medicine

## 2010-10-11 NOTE — Progress Notes (Signed)
Summary: refill request for klonopin  Phone Note Refill Request Message from:  Fax from Pharmacy  Refills Requested: Medication #1:  KLONOPIN 0.5 MG TABS 1 by mouth at bedtime as needed insomnia.   Last Refilled: 08/22/2010 [facility] [address], [phone]  Initial call taken by: Lowella Petties CMA, AAMA,  October 02, 2010 8:08 AM  Follow-up for Phone Call        [facility] [address] [facility] as instructed. Lewanda Rife LPN  October 02, 2010 10:11 AM     Prescriptions: KLONOPIN 0.5 MG TABS (CLONAZEPAM) 1 by mouth at bedtime as needed insomnia  #30 x 1   Entered and Authorized by:   Judith Part MD   Signed by:   Judith Part MD on 10/02/2010   Method used:   Telephoned to ...       [facility] [phone]* (retail)       [address] [place], [place]  [address]       Ph: [phone] or [phone]       Fax: [phone]   RxID:   [id number]

## 2010-10-16 ENCOUNTER — Other Ambulatory Visit: Payer: Self-pay | Admitting: Family Medicine

## 2010-10-16 DIAGNOSIS — Z1231 Encounter for screening mammogram for malignant neoplasm of breast: Secondary | ICD-10-CM

## 2010-11-20 ENCOUNTER — Telehealth: Payer: Self-pay | Admitting: Family Medicine

## 2010-11-20 DIAGNOSIS — E7800 Pure hypercholesterolemia, unspecified: Secondary | ICD-10-CM

## 2010-11-20 DIAGNOSIS — Z0000 Encounter for general adult medical examination without abnormal findings: Secondary | ICD-10-CM

## 2010-11-20 NOTE — Telephone Encounter (Signed)
Message copied by Roxy Manns on Tue Nov 20, 2010  1:34 PM ------      Message from: Mills Koller      Created: Tue Nov 20, 2010 12:53 PM       CPX orders please, Thanks, Camelia Eng

## 2010-11-21 ENCOUNTER — Other Ambulatory Visit (INDEPENDENT_AMBULATORY_CARE_PROVIDER_SITE_OTHER): Payer: PRIVATE HEALTH INSURANCE | Admitting: Family Medicine

## 2010-11-21 DIAGNOSIS — E7800 Pure hypercholesterolemia, unspecified: Secondary | ICD-10-CM

## 2010-11-21 DIAGNOSIS — Z0000 Encounter for general adult medical examination without abnormal findings: Secondary | ICD-10-CM

## 2010-11-21 LAB — CBC WITH DIFFERENTIAL/PLATELET
Basophils Relative: 1 % (ref 0–1)
HCT: 41.8 % (ref 36.0–46.0)
Lymphocytes Relative: 38 % (ref 12–46)
Lymphs Abs: 1.5 10*3/uL (ref 0.7–4.0)
MCH: 30.4 pg (ref 26.0–34.0)
Monocytes Relative: 9 % (ref 3–12)
Neutro Abs: 1.8 10*3/uL (ref 1.7–7.7)
RBC: 4.6 MIL/uL (ref 3.87–5.11)
RDW: 13.5 % (ref 11.5–15.5)

## 2010-11-21 LAB — COMPREHENSIVE METABOLIC PANEL
ALT: 17 U/L (ref 0–35)
Albumin: 4.6 g/dL (ref 3.5–5.2)
Glucose, Bld: 94 mg/dL (ref 70–99)
Potassium: 5 mEq/L (ref 3.5–5.3)
Total Bilirubin: 0.8 mg/dL (ref 0.3–1.2)

## 2010-11-21 NOTE — Telephone Encounter (Addendum)
Addended byMills Koller on: 11/21/2010 09:12 AM  Modules accepted: Orders Orders cancelled

## 2010-11-21 NOTE — Telephone Encounter (Signed)
Addended byMills Koller on: 11/21/2010 10:11 AM   Modules accepted: Orders

## 2010-11-22 LAB — TSH: TSH: 3.252 u[IU]/mL (ref 0.350–4.500)

## 2010-11-23 ENCOUNTER — Ambulatory Visit
Admission: RE | Admit: 2010-11-23 | Discharge: 2010-11-23 | Disposition: A | Discharge status: Home / Self Care | Payer: PRIVATE HEALTH INSURANCE | Source: Ambulatory Visit | Attending: Family Medicine | Admitting: Family Medicine

## 2010-11-23 DIAGNOSIS — Z1231 Encounter for screening mammogram for malignant neoplasm of breast: Secondary | ICD-10-CM

## 2010-11-23 LAB — HM MAMMOGRAPHY: HM Mammogram: NORMAL

## 2010-11-27 ENCOUNTER — Ambulatory Visit (INDEPENDENT_AMBULATORY_CARE_PROVIDER_SITE_OTHER): Payer: PRIVATE HEALTH INSURANCE | Admitting: Family Medicine

## 2010-11-27 ENCOUNTER — Encounter: Payer: Self-pay | Admitting: Family Medicine

## 2010-11-27 DIAGNOSIS — E7800 Pure hypercholesterolemia, unspecified: Secondary | ICD-10-CM

## 2010-11-27 DIAGNOSIS — Z0000 Encounter for general adult medical examination without abnormal findings: Secondary | ICD-10-CM

## 2010-11-27 DIAGNOSIS — G479 Sleep disorder, unspecified: Secondary | ICD-10-CM

## 2010-11-27 DIAGNOSIS — Z1211 Encounter for screening for malignant neoplasm of colon: Secondary | ICD-10-CM

## 2010-11-27 NOTE — Assessment & Plan Note (Signed)
Still frustrated but klonopin has helped  Continue this and exercise Offered ref to Dr Catalina Lunger in future for sleep if she wants it Good sleep hygiene

## 2010-11-27 NOTE — Assessment & Plan Note (Signed)
Reviewed health habits including diet and exercise and skin cancer prevention Also reviewed health mt list, fam hx and immunizations   Rev wellness labs  

## 2010-11-27 NOTE — Assessment & Plan Note (Signed)
IFOB today No hx of problems No bowel changes Pt will be open to colonosc at 56

## 2010-11-27 NOTE — Progress Notes (Signed)
  Subjective:    Patient ID: Katherine Macdonald, female    DOB: 1955/10/16, 55 y.o.   MRN: [id number]  HPI Here for health mt visit and to review chronic med problems  Is doing ok overall - not a lot of complaints  Sleep issues are still her biggest problem  klonazepam helps some -- and not as rested as she needs to  Now is getting 5 hours of sleep - wakes up and sometimes goes back to sleep  Thinks she needs 6-7 -- is interested in inc dose to 11/2  Does not notice a lot of difference  Does not want to go up to 1 mg   Good sleep hygiene and she exercises regularly      Sees gyn - this month upcoming  Pap -- last April  Pessary--Dr Nicholaus Bloom  Needs breast exam -- wants to do that today  Self exam-no lumps  Mam 3/12-- normal   Td 06  ? dexa  Lipids Lab Results  Component Value Date   CHOL 206* 11/21/2010   CHOL 179 01/30/2010   CHOL 223* 11/20/2009   Lab Results  Component Value Date   HDL 56 11/21/2010   HDL 53 04/26/4781   HDL 56 11/20/2009   Lab Results  Component Value Date   LDLCALC 134* 11/21/2010   LDLCALC 107* 01/30/2010   LDLCALC 152* 11/20/2009   Lab Results  Component Value Date   TRIG 78 11/21/2010   TRIG 94 01/30/2010   TRIG 75 11/20/2009   Lab Results  Component Value Date   CHOLHDL 3.7 11/21/2010   CHOLHDL 3.4 Ratio 01/30/2010   CHOLHDL 4.0 Ratio 11/20/2009   LDL is up from 107 to 134 this draw Is eating healthy -- following low sat fat diet    Colon screen=is doing heme test instead of colonosc -until 56    Wt is down 1 lb  Great bp of 98/68      Review of Systems  Musculoskeletal: Positive for back pain.       Occ lower back/ buttock pain  May call for ortho appt in future Ice helps  Saw chiropractor       Objective:   Physical Exam  Genitourinary: No breast swelling, tenderness, discharge or bleeding.          Assessment & Plan:

## 2010-11-27 NOTE — Assessment & Plan Note (Signed)
Cholesterol is up a bit Rev low sat fat diet Excellent habits Continue to monitor LDL 130 or lower is goal Rev labs in detail

## 2010-11-27 NOTE — Patient Instructions (Signed)
Keep up the good work with health habits  No change in medicines  Keep working on low cholesterol diet (Avoid red meat/ fried foods/ egg yolks/ fatty breakfast meats/ butter, cheese and high fat dairy/ and shellfish  ) Do stool card for colon cancer screening

## 2010-12-11 ENCOUNTER — Other Ambulatory Visit: Payer: Self-pay | Admitting: *Deleted

## 2010-12-11 MED ORDER — CLONAZEPAM 0.5 MG PO TABS: ORAL_TABLET | ORAL | Status: DC

## 2010-12-11 NOTE — Telephone Encounter (Signed)
Px written for call in   

## 2010-12-11 NOTE — Telephone Encounter (Signed)
Patient notified as instructed by telephone. [facility] [address] as instructed.

## 2010-12-18 ENCOUNTER — Other Ambulatory Visit (INDEPENDENT_AMBULATORY_CARE_PROVIDER_SITE_OTHER): Payer: PRIVATE HEALTH INSURANCE | Admitting: Family Medicine

## 2010-12-18 ENCOUNTER — Other Ambulatory Visit: Payer: PRIVATE HEALTH INSURANCE

## 2010-12-18 DIAGNOSIS — Z1211 Encounter for screening for malignant neoplasm of colon: Secondary | ICD-10-CM

## 2010-12-18 LAB — FECAL OCCULT BLOOD, IMMUNOCHEMICAL: Fecal Occult Bld: NEGATIVE

## 2010-12-18 LAB — FECAL OCCULT BLOOD, GUAIAC: Fecal Occult Blood: NEGATIVE

## 2010-12-27 NOTE — Progress Notes (Signed)
Addended byMills Koller on: 12/27/2010 11:08 AM   Modules accepted: Orders

## 2011-01-08 NOTE — Assessment & Plan Note (Signed)
NAME:  Katherine Macdonald, VAN NO.:  [id number]   MEDICAL RECORD NO.:  [id number]          PATIENT TYPE:  POB   LOCATION:  [facility] at [facility]         FACILITY:  [facility]   PHYSICIAN:  Tinnie Gens, MD        DATE OF BIRTH:  1956/06/26   DATE OF SERVICE:  01/06/2008                                  CLINIC NOTE   CHIEF COMPLAINT:  Bladder falling out.   HISTORY OF PRESENT ILLNESS:  The patient is a 55 year old gravida 2,  para 2 who was referred by Dr. Roxy Manns for bladder prolapse.  She  reports over the last 6 weeks she has had increasing pressure in the  vagina.  She feels like she is wearing a tampon and has pain when  sitting.  She feels like her bladder is full.  She is having trouble  emptying her bladder and she has urinary frequency.  Incontinence is not  a real issue for her, but she does have a little bit leakage of fluid  with sneezing or when her bladder is exceptionally full.  Her last  menses was January 2009.  She is definitely perimenopausal with hot  flashes and night sweats.  She has been skipping several months in  between her cycles and prior to that, they got a closer together.   PAST MEDICAL HISTORY:  Negative.   PAST SURGICAL HISTORY:  Tonsillectomy.   MEDICATIONS:  None.   ALLERGIES:  CODEINE and SULFA.   OBSTETRICAL HISTORY:  G2, P2, large baby, 7 pounds 12 ounces; last  delivery was 20 years ago.   GYNECOLOGICAL HISTORY:  Menarche was at age 23.  LMP was September 21, 2007.  She does had a history of abnormal Pap smear, but only required a  repeat Pap.  Her last Pap was in February 2009 and was normal.  Her last  mammogram was February 2009, also normal.   FAMILY HISTORY:  1. Heart disease.  2. Hypertension.   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  She lives with her husband and her daughter.  She has 2 grandchildren.   REVIEW OF SYSTEMS:  Fourteen-point review of systems was  reviewed.  Please see GYN history in the chart.  Positive for muscle aches and  night sweats, problem with urination with loss of urine with coughing,  hot flashes, vaginal odor and vaginal itching.   PHYSICAL EXAMINATION:  VITALS:  As noted in the chart.  GENERAL:  She is a well-developed, well-nourished female in no acute  distress.  ABDOMEN:  Soft, nontender and nondistended.  GU:  She has atrophic vaginal mucosa, as noted.  She has a cystocele,  grade 2-3.  There is a rectocele also noted.  She does have some uterine  prolapse.  The uterus is small, anteverted.  No adnexal mass or  tenderness.   IMPRESSION:  1. Cystocele.  2. Rectocele.  3. Uterine prolapse.  4. Mild urinary incontinence.   I discussed options with her.  I discussed findings and that these mean.  I discussed different treatments including pessary; however, given her  age and the fact  that she remains sexually active, and do not think this  is a good choice for her.  I discussed surgery.  I would advise urology  referral as well as because after hysterectomy and bladder repair;  incontinence may become more of an issue.  She should get formal  urodynamic testing as well.  She also would like anterior repair with  graft material.  She will also need a total vaginal hysterectomy and  rectocele repair.  The patient has many things on her plate and is  unsure about the timing of surgery, but we will refer her to him with  her complete workup and follow up here in 1-2 months for posting of  surgery.           ______________________________  Tinnie Gens, MD     TP/MEDQ  D:  01/06/2008  T:  01/06/2008  Job:  [id number]   cc:   Roxy Manns MD

## 2011-01-08 NOTE — Assessment & Plan Note (Signed)
NAME:  ZOEE, HEENEY NO.:  [id number]   MEDICAL RECORD NO.:  [id number]          PATIENT TYPE:  POB   LOCATION:  [facility] at [facility]         FACILITY:  [facility]   PHYSICIAN:  Allie Bossier, MD        DATE OF BIRTH:  1955-10-26   DATE OF SERVICE:  12/19/2009                                  CLINIC NOTE   Ms. Soward is a 55 year old married white, gravida 2, para 2.  She has  2 daughters; one age 22, and one age 8.  She has 2 grandchildren from  eldest daughter.  She comes in here for an annual exam.  She has no  particular complaints.  She does need a refill on her Vagifem which she  uses twice a week.  She is very happy with her pessary and is having no  new prolapse issues.   PAST MEDICAL HISTORY:  Cystocele, rectocele, and uterine prolapse.   PAST SURGICAL HISTORY:  Tonsillectomy and right bunionectomy.   REVIEW OF SYSTEMS:  All questions were negative.  She is married for the  last 32 years.  She is sexually active without any complaints.  Her last  Pap smear was 2009, mammogram was March of this year was reportedly  normal.  Guaiac with Dr. Milinda Antis, her family doctor, was reportedly  negative in March of this year.   She has no latex allergies.  Medicine allergies to SULFA and CODEINE.   SOCIAL HISTORY:  Denies tobacco or drugs.   FAMILY HISTORY:  Positive for breast cancer in maternal aunt, she was  diagnosed in her postmenopausal years.  She denies a family history of  GYN or colon malignancies.   PHYSICAL EXAMINATION:  GENERAL:  Well-nourished, well-hydrated, very  pleasant white female in no apparent distress.  VITAL SIGNS:  Height 5 feet 2 inches, weight 120, blood pressure 117/70,  pulse 66.  HEENT:  Normal.  HEART:  Regular rate and rhythm.  LUNGS:  Clear to auscultation bilaterally.  ABDOMEN:  Benign.  BREASTS:  Normal bilaterally.  EXTERNAL GENITALIA:  Minimal atrophy.  She does have a third-degree  cystocele, rectocele, and cervical  prolapse.  Cervix is parous with no lesions.  Bimanual exam very small, mobile  anteverted uterus.  Adnexa are not palpable.  There is no tenderness or  masses.   ASSESSMENT AND PLAN:  Annual exam.  I have checked her Pap smear,  recommended self-breast and self-vulvar exams monthly.  She is to  continue with her pessary and I have refilled her Vagifem, the dose now  is 10 mcg twice a week.      Allie Bossier, MD     MCD/MEDQ  D:  12/19/2009  T:  12/20/2009  Job:  [id number]

## 2011-01-08 NOTE — Assessment & Plan Note (Signed)
NAME:  Katherine Macdonald, Katherine Macdonald NO.:  [id number]   MEDICAL RECORD NO.:  [id number]          PATIENT TYPE:  POB   LOCATION:  [facility] at [facility]         FACILITY:  [facility]   PHYSICIAN:  Allie Bossier, MD        DATE OF BIRTH:  [birth year]   DATE OF SERVICE:                                  CLINIC NOTE   Ms. Denardo is a 55 year old married white gravida 2, para 2 who is  following up today after her urology visit.  Her complaint is worsening  stress incontinence symptoms since February 2009.  Over the last 3  weeks, sex has become so uncomfortable, that she has quit having sex.  She reports occasions of involuntary bladder loss even with walking,  strong Valsalva is not a required.  She denies urge incontinence  whatsoever.  Surgery including a TVH and anterior possible posterior  repair along with possible splinting was discussed by the urologist with  her.  She is hesitant to start with surgery as the primary method of  controlling her symptoms.  She is interested and attempting pessary use,  and only having surgery if this fails. I did a physical exam and agree  with the findings of the other two physicians.  I would add that she has  some postmenopausal atrophy and I would suggest that prior to pessary or  surgery, she will be treated with vaginal estrogen.  I have given her  prescription for Vagifem 25 mcg to be used twice a week and she will  follow up at the GYN clinic at [facility] where the pessary  recollection is available.      Allie Bossier, MD     MCD/MEDQ  D:  04/26/2008  T:  04/27/2008  Job:  [id number]

## 2011-02-04 ENCOUNTER — Ambulatory Visit: Payer: PRIVATE HEALTH INSURANCE | Admitting: Obstetrics & Gynecology

## 2011-02-18 ENCOUNTER — Other Ambulatory Visit: Payer: Self-pay | Admitting: *Deleted

## 2011-02-18 MED ORDER — CLONAZEPAM 0.5 MG PO TABS: ORAL_TABLET | ORAL | Status: DC

## 2011-02-18 NOTE — Telephone Encounter (Signed)
Okay #30 x 1 

## 2011-02-18 NOTE — Telephone Encounter (Signed)
rx called into pharmacy

## 2011-02-26 ENCOUNTER — Ambulatory Visit: Payer: PRIVATE HEALTH INSURANCE | Admitting: Obstetrics & Gynecology

## 2011-03-05 ENCOUNTER — Ambulatory Visit (INDEPENDENT_AMBULATORY_CARE_PROVIDER_SITE_OTHER): Payer: PRIVATE HEALTH INSURANCE | Admitting: Obstetrics & Gynecology

## 2011-03-05 DIAGNOSIS — Z01419 Encounter for gynecological examination (general) (routine) without abnormal findings: Secondary | ICD-10-CM

## 2011-03-05 DIAGNOSIS — Z1272 Encounter for screening for malignant neoplasm of vagina: Secondary | ICD-10-CM

## 2011-03-06 NOTE — Assessment & Plan Note (Signed)
NAME:  LANETTE, ELL NO.:  [id number]  MEDICAL RECORD NO.:  [id number]           PATIENT TYPE:  LOCATION:  [facility] at [facility]           FACILITY:  PHYSICIAN:  Allie Bossier, MD             DATE OF BIRTH:  DATE OF SERVICE:  03/05/2011                                 CLINIC NOTE  Ms. Withrow is a 55 year old married white G2, P2.  She has 2 grandchildren.  She comes in here for her annual exam.  Her only complaint is that of decreasing ability to have an orgasm.  She reports that she has good vaginal lubrication with the use of Vagifem.  Her husband does not suffer from ED.  It is just much harder for her to ever achieve orgasm with much more rarely now.  PAST MEDICAL HISTORY:  Borderline elevated cholesterol.  She has a cystocele, rectocele, and uterine prolapse.  REVIEW OF SYSTEMS:  She has been married for 33 years.  She has been menopausal since age 34 when her hypo-orgasmia began.  Hemoccult and mammogram were both negative this year and she plans to do her colonoscopy when she is 55 years of age.  PAST SURGICAL HISTORY:  Tonsillectomy, right-sided bunionectomy.  MEDICATIONS:  She takes Vagifem 25 mcg two times a week, clonazepam 0.5 mg at night on a p.r.n. basis, flaxseed oil daily, ibuprofen 600 mg on a p.r.n. basis.  ALLERGIES:  SULFA which causes rash and CODEINE, which causes nausea. She has no latex allergies.  SOCIAL HISTORY:  She denies tobacco, alcohol, or drug use.  FAMILY HISTORY:  Positive for breast cancer in a maternal aunt who was diagnosed in her postmenopausal years.  She denies family history of GYN or colon malignancies.  PHYSICAL EXAMINATION:  GENERAL:  Well-nourished, well-hydrated, very pleasant white female. VITAL SIGNS:  Height is 5 feet 1 inch, weight 117 pounds, blood pressure 124/89, pulse 70. HEENT:  Normal. BREASTS:  Normal bilaterally. HEART:  Regular rate and rhythm. LUNGS:  Clear to auscultation  bilaterally. ABDOMEN:  Scaphoid benign.  No palpable hepatosplenomegaly. EXTERNAL GENITALIA:  She has a third-degree cystocele, rectocele, and cervical prolapse.  Her bimanual exam reveals a very small anteverted mobile uterus.  Her adnexae are not palpable.  There is no tenderness or masses on her exam.  ASSESSMENT/PLAN: 1. Annual exam.  I have checked Pap smear and made sure that her     mammogram is up-to-date.  Recommended self-breast and self vulvar     exams. 2. Hypo-orgasmia.  I have written her a prescription for testosterone     ointment 2% to be used on a three time a week basis.  I have told     her that if she does not achieve orgasm more easily in the next     several months then she can discontinue the testosterone.     Allie Bossier, MD    MCD/MEDQ  D:  03/05/2011  T:  03/06/2011  Job:  [id number]

## 2011-04-30 ENCOUNTER — Other Ambulatory Visit: Payer: Self-pay | Admitting: *Deleted

## 2011-04-30 MED ORDER — CLONAZEPAM 0.5 MG PO TABS: ORAL_TABLET | ORAL | Status: DC

## 2011-04-30 NOTE — Telephone Encounter (Signed)
Px written for call in   

## 2011-04-30 NOTE — Telephone Encounter (Signed)
[facility] 

## 2011-07-11 ENCOUNTER — Other Ambulatory Visit: Payer: Self-pay | Admitting: *Deleted

## 2011-07-11 MED ORDER — CLONAZEPAM 0.5 MG PO TABS: ORAL_TABLET | ORAL | Status: DC

## 2011-07-11 NOTE — Telephone Encounter (Signed)
Px written for call in   

## 2011-07-11 NOTE — Telephone Encounter (Signed)
Medication phoned to [facility] [address] [facility] as instructed.  

## 2011-09-26 ENCOUNTER — Other Ambulatory Visit: Payer: Self-pay | Admitting: Obstetrics & Gynecology

## 2011-12-06 ENCOUNTER — Other Ambulatory Visit: Payer: Self-pay | Admitting: Family Medicine

## 2011-12-06 DIAGNOSIS — Z1231 Encounter for screening mammogram for malignant neoplasm of breast: Secondary | ICD-10-CM

## 2011-12-16 ENCOUNTER — Ambulatory Visit
Admission: RE | Admit: 2011-12-16 | Discharge: 2011-12-16 | Disposition: A | Discharge status: Home / Self Care | Payer: PRIVATE HEALTH INSURANCE | Source: Ambulatory Visit | Attending: Family Medicine | Admitting: Family Medicine

## 2011-12-16 DIAGNOSIS — Z1231 Encounter for screening mammogram for malignant neoplasm of breast: Secondary | ICD-10-CM

## 2011-12-18 ENCOUNTER — Encounter: Payer: Self-pay | Admitting: *Deleted

## 2012-01-02 ENCOUNTER — Telehealth: Payer: Self-pay | Admitting: Family Medicine

## 2012-01-02 ENCOUNTER — Other Ambulatory Visit (INDEPENDENT_AMBULATORY_CARE_PROVIDER_SITE_OTHER): Payer: BC Managed Care – PPO

## 2012-01-02 DIAGNOSIS — Z0000 Encounter for general adult medical examination without abnormal findings: Secondary | ICD-10-CM

## 2012-01-02 DIAGNOSIS — E7800 Pure hypercholesterolemia, unspecified: Secondary | ICD-10-CM

## 2012-01-02 LAB — COMPREHENSIVE METABOLIC PANEL
BUN: 13 mg/dL (ref 6–23)
CO2: 29 mEq/L (ref 19–32)
Calcium: 9.5 mg/dL (ref 8.4–10.5)
Chloride: 107 mEq/L (ref 96–112)
Creatinine, Ser: 1 mg/dL (ref 0.4–1.2)
GFR: 64.92 mL/min (ref >60.00)
Total Bilirubin: 0.7 mg/dL (ref 0.3–1.2)

## 2012-01-02 LAB — CBC WITH DIFFERENTIAL/PLATELET
Basophils Relative: 0.8 % (ref 0.0–3.0)
Eosinophils Absolute: 0.2 10*3/uL (ref 0.0–0.7)
HCT: 39.9 % (ref 36.0–46.0)
Hemoglobin: 13.3 g/dL (ref 12.0–15.0)
Lymphocytes Relative: 33.5 % (ref 12.0–46.0)
Lymphs Abs: 1.2 10*3/uL (ref 0.7–4.0)
MCHC: 33.3 g/dL (ref 30.0–36.0)
Monocytes Relative: 8.6 % (ref 3.0–12.0)
Neutro Abs: 1.9 10*3/uL (ref 1.4–7.7)
RBC: 4.31 Mil/uL (ref 3.87–5.11)

## 2012-01-02 LAB — LIPID PANEL
HDL: 57.9 mg/dL (ref >39.00)
Triglycerides: 85 mg/dL (ref 0.0–149.0)

## 2012-01-02 NOTE — Telephone Encounter (Signed)
Message copied by Judy Pimple on Thu Jan 02, 2012  7:13 AM ------      Message from: Baldomero Lamy      Created: Tue Dec 31, 2011 10:30 AM      Regarding: Cpx labs Thurs 5/9       Please order  future cpx labs for pt's upcomming lab appt.      Thanks      Rodney Booze

## 2012-01-03 ENCOUNTER — Other Ambulatory Visit: Payer: PRIVATE HEALTH INSURANCE

## 2012-01-10 ENCOUNTER — Encounter: Payer: Self-pay | Admitting: Family Medicine

## 2012-01-10 ENCOUNTER — Ambulatory Visit (INDEPENDENT_AMBULATORY_CARE_PROVIDER_SITE_OTHER): Payer: BC Managed Care – PPO | Admitting: Family Medicine

## 2012-01-10 ENCOUNTER — Ambulatory Visit (INDEPENDENT_AMBULATORY_CARE_PROVIDER_SITE_OTHER)
Admission: RE | Admit: 2012-01-10 | Discharge: 2012-01-10 | Disposition: A | Discharge status: Home / Self Care | Payer: BC Managed Care – PPO | Source: Ambulatory Visit | Attending: Family Medicine | Admitting: Family Medicine

## 2012-01-10 VITALS — BP 118/70 | HR 65 | Temp 98.9°F | Ht 61.75 in | Wt 120.8 lb

## 2012-01-10 DIAGNOSIS — IMO0001 Reserved for inherently not codable concepts without codable children: Secondary | ICD-10-CM

## 2012-01-10 DIAGNOSIS — M545 Low back pain, unspecified: Secondary | ICD-10-CM

## 2012-01-10 DIAGNOSIS — G8929 Other chronic pain: Secondary | ICD-10-CM

## 2012-01-10 DIAGNOSIS — E7800 Pure hypercholesterolemia, unspecified: Secondary | ICD-10-CM

## 2012-01-10 DIAGNOSIS — Z0000 Encounter for general adult medical examination without abnormal findings: Secondary | ICD-10-CM

## 2012-01-10 DIAGNOSIS — M7918 Myalgia, other site: Secondary | ICD-10-CM

## 2012-01-10 MED ORDER — CLONAZEPAM 0.5 MG PO TABS: ORAL_TABLET | ORAL | Status: DC

## 2012-01-10 NOTE — Assessment & Plan Note (Signed)
Reviewed health habits including diet and exercise and skin cancer prevention Also reviewed health mt list, fam hx and immunizations   Wellness labs ref

## 2012-01-10 NOTE — Progress Notes (Signed)
Subjective:    Patient ID: Katherine Macdonald, female    DOB: Sep 19, 1955, 56 y.o.   MRN: [id number]  HPI Reviewed health habits including diet and exercise and skin cancer prevention Also reviewed health mt list, fam hx and immunizations  Is doing well overall   One problem- for 2 years -- when she sits she has a lot of buttock pain - has to keep moving and twisting ? Tailbone area  Even worse to drive  Happened a few years ago -- no injury  No skin changes or drainage - does not think there are cysts or other problems    bp good 118/70  Wt is up 4 lb with bmi of 22  Colon cancer screen-- just does not know if she wants to do that yet, is just not sure Nl ifob 4/12 No family hx   Flu shot -got it in the fall   Gyn care Dr Marice Potter- has appt in July- no problems  Does wear a pessary   mammo 4/13-normal  No lumps or self exam    Labs ok Eats a healthy diet   Lab Results  Component Value Date   CHOL 199 01/02/2012   HDL 57.90 01/02/2012   LDLCALC 124* 01/02/2012   TRIG 85.0 01/02/2012   CHOLHDL 3 01/02/2012      Chemistry      Component Value Date/Time   NA 143 01/02/2012 0822   K 5.0 01/02/2012 0822   CL 107 01/02/2012 0822   CO2 29 01/02/2012 0822   BUN 13 01/02/2012 0822   CREATININE 1.0 01/02/2012 0822   CREATININE 1.01 11/21/2010 0924      Component Value Date/Time   CALCIUM 9.5 01/02/2012 0822   ALKPHOS 65 01/02/2012 0822   AST 21 01/02/2012 0822   ALT 19 01/02/2012 0822   BILITOT 0.7 01/02/2012 0822      Patient Active Problem List  Diagnoses  . HELICOBACTER PYLORI GASTRITIS  . HYPERCHOLESTEROLEMIA  . GERD  . OVERACTIVE BLADDER  . FIBROCYSTIC BREAST DISEASE  . UTERINE PROLAPSE  . MENOPAUSAL SYNDROME  . SLEEP DISORDER  . HYPERHIDROSIS  . HEADACHE  . CAROTID BRUIT, LEFT  . Health maintenance examination  . Colon cancer screening  . Routine general medical examination at a health care facility  . Chronic buttock pain  . Low back pain   Past Medical History  Diagnosis  Date  . GERD (gastroesophageal reflux disease)   . Carotid bruit 09/2007    carotid doppler- normal  . Overactive bladder   . Sleep disorder   . Presence of pessary    No past surgical history on file. History  Substance Use Topics  . Smoking status: Never Smoker   . Smokeless tobacco: Not on file  . Alcohol Use: Not on file   Family History  Problem Relation Age of Onset  . Arthritis Mother     Rheumatoid  . Heart disease Father     CABG  . Hyperlipidemia Father   . Cancer Maternal Aunt     breast   Allergies  Allergen Reactions  . Codeine     REACTION: GI upset  . Sulfonamide Derivatives     REACTION: hives  . Zolpidem Tartrate     REACTION: ? headache   Current Outpatient Prescriptions on File Prior to Visit  Medication Sig Dispense Refill  . clonazePAM (KLONOPIN) 0.5 MG tablet Take 1 - 1 1/2 tablet by mouth at bedtime as needed for insomnia.  30  tablet  3  . Multiple Vitamin (MULTIVITAMIN) tablet Take 1 tablet by mouth daily.        . Multiple Vitamins-Calcium (VIACTIV MULTI-VITAMIN) CHEW Chew 2 daily.       Marland Kitchen VAGIFEM 10 MCG TABS INSERT ONE TABLET VAGINALLY TWICE WEEKLY(REPLACES 25MG )  8 tablet  11    Review of Systems   Review of Systems  Constitutional: Negative for fever, appetite change, fatigue and unexpected weight change.  Eyes: Negative for pain and visual disturbance.  Respiratory: Negative for cough and shortness of breath.   Cardiovascular: Negative for cp or palpitations    Gastrointestinal: Negative for nausea, diarrhea and constipation.  Genitourinary: Negative for urgency and frequency.  Skin: Negative for pallor or rash   Neurological: Negative for weakness, light-headedness, numbness and headaches.  Hematological: Negative for adenopathy. Does not bruise/bleed easily.  Psychiatric/Behavioral: Negative for dysphoric mood. The patient is not nervous/anxious.      Objective:   Physical Exam  Constitutional: She appears well-developed and  well-nourished. No distress.  HENT:  Head: Normocephalic and atraumatic.  Right Ear: External ear normal.  Left Ear: External ear normal.  Nose: Nose normal.  Mouth/Throat: Oropharynx is clear and moist. No oropharyngeal exudate.  Eyes: Conjunctivae and EOM are normal. Pupils are equal, round, and reactive to light. No scleral icterus.  Neck: Normal range of motion. Neck supple. No JVD present. No thyromegaly present.  Cardiovascular: Normal rate, regular rhythm, normal heart sounds and intact distal pulses.  Exam reveals no gallop.   Pulmonary/Chest: Effort normal and breath sounds normal. No respiratory distress. She has no wheezes. She exhibits no tenderness.  Abdominal: Soft. Bowel sounds are normal. She exhibits no distension and no mass. There is no tenderness.  Genitourinary: No breast swelling, tenderness, discharge or bleeding.       Breast exam: No mass, nodules, thickening, tenderness, bulging, retraction, inflamation, nipple discharge or skin changes noted.  No axillary or clavicular LA.  Chaperoned exam.    Musculoskeletal: Normal range of motion. She exhibits tenderness. She exhibits no edema.       Very slightly tender over tailbone/ coccyx area  Nl rom LS and hips  Nl gait  Lymphadenopathy:    She has no cervical adenopathy.  Neurological: She is alert. She has normal reflexes. No cranial nerve deficit. She exhibits normal muscle tone. Coordination normal.  Skin: Skin is warm and dry. No rash noted. No erythema. No pallor.  Psychiatric: She has a normal mood and affect.          Assessment & Plan:

## 2012-01-10 NOTE — Assessment & Plan Note (Signed)
Low back/ buttock pain 2 years  Check x ray  Is considering screen colonosc

## 2012-01-10 NOTE — Patient Instructions (Signed)
Work on [employer] and exercise and maintain your weight See gyn in summer as planned

## 2012-01-13 ENCOUNTER — Telehealth: Payer: Self-pay | Admitting: Family Medicine

## 2012-01-13 NOTE — Telephone Encounter (Signed)
Patient advised of her xray results via telephone.

## 2012-01-13 NOTE — Telephone Encounter (Signed)
Maj, [phone] Returning call from [place] regarding her x-ray results and she requested to speak with [facility] or Dr. Milinda Antis regarding same.  Information noted and sent to office for follow up.

## 2012-01-13 NOTE — Telephone Encounter (Signed)
Will route to [facility]

## 2012-01-17 ENCOUNTER — Telehealth: Payer: Self-pay | Admitting: *Deleted

## 2012-01-17 DIAGNOSIS — M545 Low back pain: Secondary | ICD-10-CM

## 2012-01-17 DIAGNOSIS — G8929 Other chronic pain: Secondary | ICD-10-CM

## 2012-01-17 NOTE — Telephone Encounter (Signed)
Patient would like Dr. Milinda Antis to do a referral to see an Ortho doctor regarding her last xray of her lumbar spine.  She will wait to hear from [facility].

## 2012-01-17 NOTE — Telephone Encounter (Signed)
Referral done

## 2012-01-28 ENCOUNTER — Other Ambulatory Visit: Payer: Self-pay | Admitting: Obstetrics & Gynecology

## 2012-03-20 ENCOUNTER — Ambulatory Visit: Payer: BC Managed Care – PPO | Admitting: Obstetrics & Gynecology

## 2012-04-13 ENCOUNTER — Encounter: Payer: Self-pay | Admitting: Family Medicine

## 2012-04-13 ENCOUNTER — Ambulatory Visit (INDEPENDENT_AMBULATORY_CARE_PROVIDER_SITE_OTHER): Payer: BC Managed Care – PPO | Admitting: Family Medicine

## 2012-04-13 VITALS — BP 118/72 | HR 64 | Temp 98.2°F | Ht 62.0 in | Wt 122.0 lb

## 2012-04-13 DIAGNOSIS — L255 Unspecified contact dermatitis due to plants, except food: Secondary | ICD-10-CM

## 2012-04-13 DIAGNOSIS — L237 Allergic contact dermatitis due to plants, except food: Secondary | ICD-10-CM

## 2012-04-13 MED ORDER — PREDNISONE 10 MG PO TABS: ORAL_TABLET | ORAL | Status: DC

## 2012-04-13 NOTE — Progress Notes (Signed)
Subjective:    Patient ID: Katherine Macdonald, female    DOB: 11/10/1955, 56 y.o.   MRN: [id number]  HPI Here with 9th day of poison ivy rash  She was pulling kudzu out of a  [place] -- did not recognize it -- broke out that night  First on her L arm - then R shoulder and abdomen and thigh   Very itchy Covered the area on her arm  Nothing on her face   She has tried many otc meds- rubbing alcohol / tech nu/ mouthwash  Tried benadryl cream  Also oral benadryl at night   Patient Active Problem List  Diagnosis  . HELICOBACTER PYLORI GASTRITIS  . HYPERCHOLESTEROLEMIA  . GERD  . OVERACTIVE BLADDER  . FIBROCYSTIC BREAST DISEASE  . UTERINE PROLAPSE  . MENOPAUSAL SYNDROME  . SLEEP DISORDER  . HYPERHIDROSIS  . HEADACHE  . CAROTID BRUIT, LEFT  . Health maintenance examination  . Colon cancer screening  . Routine general medical examination at a health care facility  . Chronic buttock pain  . Low back pain   Past Medical History  Diagnosis Date  . GERD (gastroesophageal reflux disease)   . Carotid bruit 09/2007    carotid doppler- normal  . Overactive bladder   . Sleep disorder   . Presence of pessary    No past surgical history on file. History  Substance Use Topics  . Smoking status: Never Smoker   . Smokeless tobacco: Not on file  . Alcohol Use: No   Family History  Problem Relation Age of Onset  . Arthritis Mother     Rheumatoid  . Heart disease Father     CABG  . Hyperlipidemia Father   . Cancer Maternal Aunt     breast   Allergies  Allergen Reactions  . Codeine     REACTION: GI upset  . Sulfonamide Derivatives     REACTION: hives  . Zolpidem Tartrate     REACTION: ? headache   Current Outpatient Prescriptions on File Prior to Visit  Medication Sig Dispense Refill  . clonazePAM (KLONOPIN) 0.5 MG tablet Take 1 - 1 1/2 tablet by mouth at bedtime as needed for insomnia.  30 tablet  3  . Flaxseed, Linseed, 1000 MG CAPS Take 1 capsule by mouth daily.      .  Multiple Vitamin (MULTIVITAMIN) tablet Take 1 tablet by mouth daily.        . Multiple Vitamins-Calcium (VIACTIV MULTI-VITAMIN) CHEW Chew 2 daily.       Marland Kitchen VAGIFEM 10 MCG TABS INSERT ONE TABLET VAGINALLY TWICE WEEKLY(REPLACES 25MG )  8 tablet  2      Review of Systems Review of Systems  Constitutional: Negative for fever, appetite change, fatigue and unexpected weight change.  Eyes: Negative for pain and visual disturbance.  Respiratory: Negative for cough and shortness of breath.   Cardiovascular: Negative for cp or palpitations    Gastrointestinal: Negative for nausea, diarrhea and constipation.  Genitourinary: Negative for urgency and frequency.  Skin: Negative for pallor and pos for rash with itching  Neurological: Negative for weakness, light-headedness, numbness and headaches.  Hematological: Negative for adenopathy. Does not bruise/bleed easily.  Psychiatric/Behavioral: Negative for dysphoric mood. The patient is not nervous/anxious.         Objective:   Physical Exam  Constitutional: She appears well-developed and well-nourished. No distress.  HENT:  Head: Normocephalic and atraumatic.  Eyes: Conjunctivae and EOM are normal. Pupils are equal, round, and reactive to light.  Neck: Normal range of motion. Neck supple.  Cardiovascular: Normal rate and regular rhythm.   Pulmonary/Chest: Effort normal and breath sounds normal. She has no wheezes.  Musculoskeletal: She exhibits no edema.  Lymphadenopathy:    She has no cervical adenopathy.  Neurological: She is alert.  Skin: Skin is warm and dry. Rash noted. There is erythema. No pallor.       Clusters and linear areas of vesicular rash on L arm/ R shoulder/ abdomen and upper L thigh Some oozing clear yellow fluid Few excoriations No signs of bacterial infection  Psychiatric: She has a normal mood and affect.          Assessment & Plan:

## 2012-04-13 NOTE — Patient Instructions (Addendum)
Keep cool  Wash everything that came in contact with the plant  Try oral zyrtec 10 mg once daily for itch  Keep areas clean and dry Take the prednisone taper as directed   If any signs of infection - pus or fever - let me know

## 2012-04-13 NOTE — Assessment & Plan Note (Signed)
Diffuse over many areas - no signs of superinfection Has failed consv tx  Disc symptomatic care - see instructions on AVS  Will try prednisone taper 30 mg -- disc poss side eff in detail Disc need to wash clothes/ etc Update if not starting to improve in a week or if worsening

## 2012-04-14 ENCOUNTER — Other Ambulatory Visit: Payer: PRIVATE HEALTH INSURANCE

## 2012-04-21 ENCOUNTER — Encounter: Payer: PRIVATE HEALTH INSURANCE | Admitting: Family Medicine

## 2012-05-12 ENCOUNTER — Telehealth: Payer: Self-pay | Admitting: Family Medicine

## 2012-05-12 NOTE — Telephone Encounter (Signed)
Caller: Cyriah/Patient; Patient Name: Katherine Macdonald; PCP: [facility]); Best Callback Phone Number: [phone]; Reason for call: "I have developed poison ivy."  Areas involved:  left wrist; onset 05/12/12. Emergent sx ruled out.  Home care and parameters for callback given per Poison [place], [place] or [place] protocol.

## 2012-05-12 NOTE — Telephone Encounter (Signed)
Noted. Thanks.  May see if needed.

## 2012-06-23 ENCOUNTER — Other Ambulatory Visit: Payer: Self-pay | Admitting: *Deleted

## 2012-06-23 MED ORDER — CLONAZEPAM 0.5 MG PO TABS: ORAL_TABLET | ORAL | Status: DC

## 2012-06-23 NOTE — Telephone Encounter (Signed)
Rx called in as prescribed 

## 2012-06-23 NOTE — Telephone Encounter (Signed)
Px written for call in   

## 2012-08-03 ENCOUNTER — Other Ambulatory Visit: Payer: Self-pay | Admitting: *Deleted

## 2012-11-20 ENCOUNTER — Other Ambulatory Visit: Payer: Self-pay

## 2012-11-20 DIAGNOSIS — Z1231 Encounter for screening mammogram for malignant neoplasm of breast: Secondary | ICD-10-CM

## 2012-12-22 ENCOUNTER — Ambulatory Visit
Admission: RE | Admit: 2012-12-22 | Discharge: 2012-12-22 | Disposition: A | Discharge status: Home / Self Care | Payer: BC Managed Care – PPO | Source: Ambulatory Visit

## 2012-12-22 DIAGNOSIS — Z1231 Encounter for screening mammogram for malignant neoplasm of breast: Secondary | ICD-10-CM

## 2012-12-23 ENCOUNTER — Encounter: Payer: Self-pay | Admitting: *Deleted

## 2013-01-05 ENCOUNTER — Telehealth: Payer: Self-pay | Admitting: Family Medicine

## 2013-01-05 DIAGNOSIS — E7800 Pure hypercholesterolemia, unspecified: Secondary | ICD-10-CM

## 2013-01-05 DIAGNOSIS — Z0000 Encounter for general adult medical examination without abnormal findings: Secondary | ICD-10-CM

## 2013-01-05 NOTE — Telephone Encounter (Signed)
Message copied by Judy Pimple on Tue Jan 05, 2013 10:39 PM ------      Message from: Baldomero Lamy      Created: Thu Dec 24, 2012 12:49 PM      Regarding: Cpx labs 5/14 Wed       Please order  future cpx labs for pt's upcoming lab appt.      Thanks      Tasha       ------

## 2013-01-06 ENCOUNTER — Other Ambulatory Visit (INDEPENDENT_AMBULATORY_CARE_PROVIDER_SITE_OTHER): Payer: BC Managed Care – PPO

## 2013-01-06 DIAGNOSIS — E7800 Pure hypercholesterolemia, unspecified: Secondary | ICD-10-CM

## 2013-01-06 DIAGNOSIS — Z0000 Encounter for general adult medical examination without abnormal findings: Secondary | ICD-10-CM

## 2013-01-06 LAB — CBC WITH DIFFERENTIAL/PLATELET
Eosinophils Relative: 4.1 % (ref 0.0–5.0)
HCT: 40.3 % (ref 36.0–46.0)
Hemoglobin: 13.8 g/dL (ref 12.0–15.0)
Lymphs Abs: 0.9 10*3/uL (ref 0.7–4.0)
Monocytes Relative: 11.7 % (ref 3.0–12.0)
Neutro Abs: 2.2 10*3/uL (ref 1.4–7.7)
WBC: 3.7 10*3/uL — ABNORMAL LOW (ref 4.5–10.5)

## 2013-01-06 LAB — COMPREHENSIVE METABOLIC PANEL
CO2: 28 mEq/L (ref 19–32)
Creatinine, Ser: 1 mg/dL (ref 0.4–1.2)
GFR: 61.67 mL/min (ref >60.00)
Glucose, Bld: 90 mg/dL (ref 70–99)
Sodium: 139 mEq/L (ref 135–145)
Total Bilirubin: 0.9 mg/dL (ref 0.3–1.2)
Total Protein: 6.8 g/dL (ref 6.0–8.3)

## 2013-01-06 LAB — LIPID PANEL
Cholesterol: 203 mg/dL — ABNORMAL HIGH (ref 0–200)
VLDL: 16 mg/dL (ref 0.0–40.0)

## 2013-01-11 ENCOUNTER — Encounter: Payer: Self-pay | Admitting: Radiology

## 2013-01-12 ENCOUNTER — Encounter: Payer: Self-pay | Admitting: Internal Medicine

## 2013-01-12 ENCOUNTER — Encounter: Payer: Self-pay | Admitting: Family Medicine

## 2013-01-12 ENCOUNTER — Ambulatory Visit (INDEPENDENT_AMBULATORY_CARE_PROVIDER_SITE_OTHER): Payer: BC Managed Care – PPO | Admitting: Family Medicine

## 2013-01-12 VITALS — BP 112/76 | HR 68 | Temp 98.9°F | Ht 61.5 in | Wt 122.0 lb

## 2013-01-12 DIAGNOSIS — Z1211 Encounter for screening for malignant neoplasm of colon: Secondary | ICD-10-CM

## 2013-01-12 DIAGNOSIS — N814 Uterovaginal prolapse, unspecified: Secondary | ICD-10-CM

## 2013-01-12 DIAGNOSIS — Z0000 Encounter for general adult medical examination without abnormal findings: Secondary | ICD-10-CM

## 2013-01-12 DIAGNOSIS — E7800 Pure hypercholesterolemia, unspecified: Secondary | ICD-10-CM

## 2013-01-12 NOTE — Assessment & Plan Note (Signed)
Pt needs to get est with new gyn for pessary care and annual exams  Overall doing well

## 2013-01-12 NOTE — Assessment & Plan Note (Signed)
Disc goals for lipids and reasons to control them Rev labs with pt Rev low sat fat diet in detail Will continue to watch diet

## 2013-01-12 NOTE — Assessment & Plan Note (Signed)
Ref for first screening colonoscopy  

## 2013-01-12 NOTE — Assessment & Plan Note (Signed)
Reviewed health habits including diet and exercise and skin cancer prevention Also reviewed health mt list, fam hx and immunizations  Wellness labs reviewed  Good habits

## 2013-01-12 NOTE — Patient Instructions (Addendum)
We will do GI (colonoscopy) referral and gyn referral at check out  Take care of yourself Avoid red meat/ fried foods/ egg yolks/ fatty breakfast meats/ butter, cheese and high fat dairy/ and shellfish

## 2013-01-12 NOTE — Progress Notes (Signed)
Subjective:    Patient ID: Katherine Macdonald, female    DOB: 1955-09-30, 57 y.o.   MRN: [id number]  HPI Here for health maintenance exam and to review chronic medical problems    Wt is stable with bmi of 22 Is hard work keeping that under control  She watches what she eats   Colon cancer screen - has not had a colonoscopy  Thinks she is interested in one now  Heme cards 2 y ago   Pap was 4/12  Has a pessary , and she has vagifem (does not use it )  It is too difficult to get in with Dr Marice Potter- but she is not in office full time so needs someone new  [employer] office  She does very well with pessary   mammo 4/14 Self exam- no lumps or changes   Flu vaccine 10/13   Td 11/06  Lab Results  Component Value Date   CHOL 203* 01/06/2013   CHOL 199 01/02/2012   CHOL 206* 11/21/2010   Lab Results  Component Value Date   HDL 50.90 01/06/2013   HDL 14.78 01/02/2012   HDL 56 11/21/2010   Lab Results  Component Value Date   LDLCALC 124* 01/02/2012   LDLCALC 134* 11/21/2010   LDLCALC 107* 01/30/2010   Lab Results  Component Value Date   TRIG 80.0 01/06/2013   TRIG 85.0 01/02/2012   TRIG 78 11/21/2010   Lab Results  Component Value Date   CHOLHDL 4 01/06/2013   CHOLHDL 3 01/02/2012   CHOLHDL 3.7 11/21/2010   Lab Results  Component Value Date   LDLDIRECT 133.0 01/06/2013   diet is good overall  ? If he has high chol in the family   Patient Active Problem List   Diagnosis Date Noted  . 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 Chronic buttock pain 01/10/2012  . Low back pain 01/10/2012  . Routine general medical examination at a health care facility 01/02/2012  . Colon cancer screening 11/27/2010  . HEADACHE 07/06/2010  . HYPERCHOLESTEROLEMIA 01/30/2010  . UTERINE PROLAPSE 11/15/2008  . MENOPAUSAL SYNDROME 11/15/2008  . SLEEP DISORDER 12/25/2007  . CAROTID BRUIT, LEFT 10/06/2007  . HELICOBACTER PYLORI GASTRITIS 10/05/2007  . GERD 10/05/2007  . OVERACTIVE BLADDER  10/05/2007  . FIBROCYSTIC BREAST DISEASE 10/05/2007  . HYPERHIDROSIS 10/05/2007   Past Medical History  Diagnosis Date  . GERD (gastroesophageal reflux disease)   . Carotid bruit 09/2007    carotid doppler- normal  . Overactive bladder   . Sleep disorder   . Presence of pessary    No past surgical history on file. History  Substance Use Topics  . Smoking status: Never Smoker   . Smokeless tobacco: Not on file  . Alcohol Use: No   Family History  Problem Relation Age of Onset  . Arthritis Mother     Rheumatoid  . Heart disease Father     CABG  . Hyperlipidemia Father   . Cancer Maternal Aunt     breast   Allergies  Allergen Reactions  . Codeine     REACTION: GI upset  . Sulfonamide Derivatives     REACTION: hives  . Zolpidem Tartrate     REACTION: ? headache   Current Outpatient Prescriptions on File Prior to Visit  Medication Sig Dispense Refill  . clonazePAM (KLONOPIN) 0.5 MG tablet Take 1 - 1 1/2 tablet by mouth at bedtime as needed for insomnia.  30 tablet  5  . Flaxseed, Linseed,  1000 MG CAPS Take 1 capsule by mouth 3 (three) times a week.       . Multiple Vitamin (MULTIVITAMIN) tablet Take 1 tablet by mouth daily.         No current facility-administered medications on file prior to visit.      Review of Systems Review of Systems  Constitutional: Negative for fever, appetite change, fatigue and unexpected weight change.  Eyes: Negative for pain and visual disturbance.  Respiratory: Negative for cough and shortness of breath.   Cardiovascular: Negative for cp or palpitations    Gastrointestinal: Negative for nausea, diarrhea and constipation.  Genitourinary: Negative for urgency and frequency.  Skin: Negative for pallor or rash   Neurological: Negative for weakness, light-headedness, numbness and headaches.  Hematological: Negative for adenopathy. Does not bruise/bleed easily.  Psychiatric/Behavioral: Negative for dysphoric mood. The patient is not  nervous/anxious.         Objective:   Physical Exam  Constitutional: She appears well-developed and well-nourished. No distress.  HENT:  Head: Normocephalic and atraumatic.  Right Ear: External ear normal.  Left Ear: External ear normal.  Nose: Nose normal.  Mouth/Throat: Oropharynx is clear and moist.  Eyes: Conjunctivae and EOM are normal. Pupils are equal, round, and reactive to light. Right eye exhibits no discharge. Left eye exhibits no discharge. No scleral icterus.  Neck: Normal range of motion. Neck supple. No JVD present. Carotid bruit is not present. No thyromegaly present.  Cardiovascular: Normal rate, regular rhythm, normal heart sounds and intact distal pulses.  Exam reveals no gallop.   Pulmonary/Chest: Effort normal and breath sounds normal. No respiratory distress. She has no wheezes. She has no rales.  Abdominal: Soft. Bowel sounds are normal. She exhibits no distension, no abdominal bruit and no mass. There is no tenderness.  Musculoskeletal: She exhibits no edema and no tenderness.  Lymphadenopathy:    She has no cervical adenopathy.  Neurological: She is alert. She has normal reflexes. No cranial nerve deficit. She exhibits normal muscle tone. Coordination normal.  Skin: Skin is warm and dry. No rash noted. No erythema. No pallor.  Solar lentigos diffusely   Psychiatric: She has a normal mood and affect.          Assessment & Plan:

## 2013-02-01 ENCOUNTER — Other Ambulatory Visit: Payer: Self-pay | Admitting: *Deleted

## 2013-02-01 MED ORDER — CLONAZEPAM 0.5 MG PO TABS: ORAL_TABLET | ORAL | Status: DC

## 2013-02-01 NOTE — Telephone Encounter (Signed)
Px written for call in   

## 2013-02-01 NOTE — Telephone Encounter (Signed)
Rx called in as prescribed 

## 2013-02-08 ENCOUNTER — Encounter: Payer: Self-pay | Admitting: Family Medicine

## 2013-03-12 ENCOUNTER — Ambulatory Visit (AMBULATORY_SURGERY_CENTER): Payer: BC Managed Care – PPO | Admitting: *Deleted

## 2013-03-12 ENCOUNTER — Telehealth: Payer: Self-pay | Admitting: *Deleted

## 2013-03-12 ENCOUNTER — Encounter: Payer: Self-pay | Admitting: Internal Medicine

## 2013-03-12 DIAGNOSIS — Z1211 Encounter for screening for malignant neoplasm of colon: Secondary | ICD-10-CM

## 2013-03-12 MED ORDER — NA SULFATE-K SULFATE-MG SULF 17.5-3.13-1.6 GM/177ML PO SOLN: 1.0000 | Freq: Once | ORAL | Status: DC

## 2013-03-12 NOTE — Telephone Encounter (Signed)
Patient no show for previsit appointment 03/12/13 at 1000 am. Message left on cell phone voice mail that previsit appointment will need to be rescheduled today by 5pm to avoid colonoscopy appointment scheduled for August 1st from being cancelled. If patient does not call in today before 5 pm, colonoscopy will be cancelled and both previsit and procedure visits will need to be rescheduled. Letter will also be sent regarding missed appointment if no phone call received.

## 2013-03-16 ENCOUNTER — Encounter: Payer: Self-pay | Admitting: Internal Medicine

## 2013-03-24 ENCOUNTER — Encounter: Payer: Self-pay | Admitting: Internal Medicine

## 2013-03-24 NOTE — Addendum Note (Signed)
Addended by: Maple Hudson on: 03/24/2013 10:35 AM   Modules accepted: Level of Service

## 2013-03-26 ENCOUNTER — Encounter: Payer: Self-pay | Admitting: Internal Medicine

## 2013-03-26 ENCOUNTER — Ambulatory Visit (AMBULATORY_SURGERY_CENTER): Payer: BC Managed Care – PPO | Admitting: Internal Medicine

## 2013-03-26 VITALS — BP 114/53 | HR 55 | Temp 98.4°F | Resp 10 | Ht 61.5 in | Wt 122.0 lb

## 2013-03-26 DIAGNOSIS — Z1211 Encounter for screening for malignant neoplasm of colon: Secondary | ICD-10-CM

## 2013-03-26 DIAGNOSIS — K5730 Diverticulosis of large intestine without perforation or abscess without bleeding: Secondary | ICD-10-CM

## 2013-03-26 MED ORDER — SODIUM CHLORIDE 0.9 % IV SOLN: 500.0000 mL | INTRAVENOUS | Status: DC

## 2013-03-26 NOTE — Patient Instructions (Addendum)
You have some diverticulosis but no polyps or cancer were seen. Diverticulosis is common and not usually a problem. The prep was excellent.  I recommend repeating a routine colonoscopy in 2024.  I appreciate the opportunity to care for you. Iva Boop, MD, FACG YOU HAD AN ENDOSCOPIC PROCEDURE TODAY AT THE [facility]: Refer to the procedure report that was given to you for any specific questions about what was found during the examination.  If the procedure report does not answer your questions, please call your gastroenterologist to clarify.  If you requested that your care partner not be given the details of your procedure findings, then the procedure report has been included in a sealed envelope for you to review at your convenience later.  YOU SHOULD EXPECT: Some feelings of bloating in the abdomen. Passage of more gas than usual.  Walking can help get rid of the air that was put into your GI tract during the procedure and reduce the bloating. If you had a lower endoscopy (such as a colonoscopy or flexible sigmoidoscopy) you may notice spotting of blood in your stool or on the toilet paper. If you underwent a bowel prep for your procedure, then you may not have a normal bowel movement for a few days.  DIET: Your first meal following the procedure should be a light meal and then it is ok to progress to your normal diet.  A half-sandwich or bowl of soup is an example of a good first meal.  Heavy or fried foods are harder to digest and may make you feel nauseous or bloated.  Likewise meals heavy in dairy and vegetables can cause extra gas to form and this can also increase the bloating.  Drink plenty of fluids but you should avoid alcoholic beverages for 24 hours.  ACTIVITY: Your care partner should take you home directly after the procedure.  You should plan to take it easy, moving slowly for the rest of the day.  You can resume normal activity the day after the procedure however you  should NOT DRIVE or use heavy machinery for 24 hours (because of the sedation medicines used during the test).    SYMPTOMS TO REPORT IMMEDIATELY: A gastroenterologist can be reached at any hour.  During normal business hours, 8:30 AM to 5:00 PM Monday through Friday, call [phone].  After hours and on weekends, please call the GI answering service at [phone] who will take a message and have the physician on call contact you.   Following lower endoscopy (colonoscopy or flexible sigmoidoscopy):  Excessive amounts of blood in the stool  Significant tenderness or worsening of abdominal pains  Swelling of the abdomen that is new, acute  Fever of 100F or higher   FOLLOW UP: If any biopsies were taken you will be contacted by phone or by letter within the next 1-3 weeks.  Call your gastroenterologist if you have not heard about the biopsies in 3 weeks.  Our staff will call the home number listed on your records the next business day following your procedure to check on you and address any questions or concerns that you may have at that time regarding the information given to you following your procedure. This is a courtesy call and so if there is no answer at the home number and we have not heard from you through the emergency physician on call, we will assume that you have returned to your regular daily activities without incident.  SIGNATURES/CONFIDENTIALITY: You  and/or your care partner have signed paperwork which will be entered into your electronic medical record.  These signatures attest to the fact that that the information above on your After Visit Summary has been reviewed and is understood.  Full responsibility of the confidentiality of this discharge information lies with you and/or your care-partner.   Diverticulosis information given.  Repeat colonoscopy in 10 years.

## 2013-03-26 NOTE — Op Note (Signed)
[facility] [address] [place], [address]   COLONOSCOPY PROCEDURE REPORT  PATIENT: Katherine, Macdonald  MR#: [id number] BIRTHDATE: 06/23/1956 , 57  yrs. old GENDER: Female ENDOSCOPIST: Iva Boop, MD, [facility] REFERRED UU:[id number] Fransisca Connors, M.D. PROCEDURE DATE:  03/26/2013 PROCEDURE:   Colonoscopy, screening First Screening Colonoscopy - Avg.  risk and is 50 yrs.  old or older Yes.  Prior Negative Screening - Now for repeat screening. N/A  History of Adenoma - Now for follow-up colonoscopy & has been > or = to 3 yrs.  N/A ASA CLASS:   Class I INDICATIONS:average risk screening. MEDICATIONS: Propofol (Diprivan) 140 mg IV, MAC sedation, administered by CRNA, and These medications were titrated to patient response per physician's verbal order  DESCRIPTION OF PROCEDURE:   After the risks benefits and alternatives of the procedure were thoroughly explained, informed consent was obtained.  A digital rectal exam revealed no abnormalities of the rectum.   The LB [id number]  endoscope was introduced through the anus and advanced to the cecum, which was identified by both the appendix and ileocecal valve. No adverse events experienced.   The quality of the prep was excellent using Suprep  The instrument was then slowly withdrawn as the colon was fully examined.      COLON FINDINGS: Moderate diverticulosis was noted in the sigmoid colon.   The colon mucosa was otherwise normal.   A right colon retroflexion was performed.  Retroflexed views revealed no abnormalities. The time to cecum=2 minutes 20 seconds.  Withdrawal time=8 minutes 17 seconds.  The scope was withdrawn and the procedure completed. COMPLICATIONS: There were no complications.  ENDOSCOPIC IMPRESSION: 1.   Moderate diverticulosis was noted in the sigmoid colon 2.   The colon mucosa was otherwise normal  RECOMMENDATIONS: Repeat Colonscopy in 10 years - 2024   eSigned:  Iva Boop, MD, [facility]  03/26/2013 10:25 AM   cc: Judy Pimple, MD and The Patient

## 2013-03-26 NOTE — Progress Notes (Signed)
Procedure ends, to recovery, report given and VSS., 

## 2013-03-26 NOTE — Progress Notes (Signed)
Patient did not experience any of the following events: a burn prior to discharge; a fall within the facility; wrong site/side/patient/procedure/implant event; or a hospital transfer or hospital admission upon discharge from the facility. (G8907) Patient did not have preoperative order for IV antibiotic SSI prophylaxis. (G8918)  

## 2013-03-29 ENCOUNTER — Telehealth: Payer: Self-pay

## 2013-03-29 NOTE — Telephone Encounter (Signed)
Pt left v/m received [employer] claim form for 01/15/13; pt said not seen that day.Pt advised to call [employer] [phone].

## 2013-03-29 NOTE — Telephone Encounter (Signed)
  Follow up Call-  Call back number 03/26/2013  Post procedure Call Back phone  # [phone]  Permission to leave phone message Yes     Patient questions:  Do you have a fever, pain , or abdominal swelling? no Pain Score  0 *  Have you tolerated food without any problems? yes  Have you been able to return to your normal activities? yes  Do you have any questions about your discharge instructions: Diet   no Medications  no Follow up visit  no  Do you have questions or concerns about your Care? no  Actions: * If pain score is 4 or above: No action needed, pain <4.

## 2013-07-01 ENCOUNTER — Other Ambulatory Visit: Payer: Self-pay

## 2013-11-25 ENCOUNTER — Inpatient Hospital Stay: Payer: Self-pay | Admitting: Internal Medicine

## 2013-11-25 LAB — CBC WITH DIFFERENTIAL/PLATELET
BASOS ABS: 0.1 10*3/uL (ref 0.0–0.1)
BASOS PCT: 0.5 %
EOS ABS: 0 10*3/uL (ref 0.0–0.7)
EOS PCT: 0 %
HCT: 41.1 % (ref 35.0–47.0)
HGB: 13.5 g/dL (ref 12.0–16.0)
Lymphocyte #: 0.5 10*3/uL — ABNORMAL LOW (ref 1.0–3.6)
Lymphocyte %: 4 %
MCH: 29.8 pg (ref 26.0–34.0)
MCHC: 32.8 g/dL (ref 32.0–36.0)
MCV: 91 fL (ref 80–100)
MONO ABS: 0.4 x10 3/mm (ref 0.2–0.9)
Monocyte %: 3.5 %
NEUTROS ABS: 10.6 10*3/uL — AB (ref 1.4–6.5)
NEUTROS PCT: 92 %
Platelet: 217 10*3/uL (ref 150–440)
RBC: 4.52 10*6/uL (ref 3.80–5.20)
RDW: 13.5 % (ref 11.5–14.5)
WBC: 11.5 10*3/uL — ABNORMAL HIGH (ref 3.6–11.0)

## 2013-11-25 LAB — COMPREHENSIVE METABOLIC PANEL
ANION GAP: 4 — AB (ref 7–16)
Albumin: 4.2 g/dL (ref 3.4–5.0)
Alkaline Phosphatase: 96 U/L
BILIRUBIN TOTAL: 0.5 mg/dL (ref 0.2–1.0)
BUN: 15 mg/dL (ref 7–18)
CALCIUM: 9 mg/dL (ref 8.5–10.1)
CHLORIDE: 102 mmol/L (ref 98–107)
CO2: 29 mmol/L (ref 21–32)
CREATININE: 0.95 mg/dL (ref 0.60–1.30)
EGFR (African American): >60
EGFR (Non-African Amer.): >60
Glucose: 154 mg/dL — ABNORMAL HIGH (ref 65–99)
Osmolality: 274 (ref 275–301)
POTASSIUM: 3.6 mmol/L (ref 3.5–5.1)
SGOT(AST): 24 U/L (ref 15–37)
SGPT (ALT): 25 U/L (ref 12–78)
Sodium: 135 mmol/L — ABNORMAL LOW (ref 136–145)
TOTAL PROTEIN: 7.7 g/dL (ref 6.4–8.2)

## 2013-11-25 LAB — URINALYSIS, COMPLETE
BACTERIA: NONE SEEN
BILIRUBIN, UR: NEGATIVE
Glucose,UR: 150 mg/dL (ref 0–75)
KETONE: NEGATIVE
NITRITE: NEGATIVE
PH: 8 (ref 4.5–8.0)
Protein: 30
RBC,UR: 21 /HPF (ref 0–5)
SPECIFIC GRAVITY: 1.023 (ref 1.003–1.030)
Squamous Epithelial: 5
WBC UR: 1 /HPF (ref 0–5)

## 2013-11-25 LAB — LIPASE, BLOOD: Lipase: 203 U/L (ref 73–393)

## 2013-11-25 LAB — TROPONIN I: Troponin-I: <0.02 ng/mL

## 2013-11-26 LAB — CBC WITH DIFFERENTIAL/PLATELET
BASOS ABS: 0.1 10*3/uL (ref 0.0–0.1)
BASOS PCT: 0.8 %
EOS ABS: 0 10*3/uL (ref 0.0–0.7)
EOS PCT: 0 %
HCT: 35.4 % (ref 35.0–47.0)
HGB: 11.7 g/dL — ABNORMAL LOW (ref 12.0–16.0)
LYMPHS PCT: 4.2 %
Lymphocyte #: 0.6 10*3/uL — ABNORMAL LOW (ref 1.0–3.6)
MCH: 30.1 pg (ref 26.0–34.0)
MCHC: 32.9 g/dL (ref 32.0–36.0)
MCV: 91 fL (ref 80–100)
Monocyte #: 0.8 x10 3/mm (ref 0.2–0.9)
Monocyte %: 6 %
NEUTROS PCT: 89 %
Neutrophil #: 11.7 10*3/uL — ABNORMAL HIGH (ref 1.4–6.5)
PLATELETS: 171 10*3/uL (ref 150–440)
RBC: 3.88 10*6/uL (ref 3.80–5.20)
RDW: 13.4 % (ref 11.5–14.5)
WBC: 13.2 10*3/uL — AB (ref 3.6–11.0)

## 2013-11-26 LAB — COMPREHENSIVE METABOLIC PANEL
ALK PHOS: 78 U/L
Albumin: 2.9 g/dL — ABNORMAL LOW (ref 3.4–5.0)
Anion Gap: 2 — ABNORMAL LOW (ref 7–16)
BUN: 9 mg/dL (ref 7–18)
Bilirubin,Total: 0.8 mg/dL (ref 0.2–1.0)
CO2: 30 mmol/L (ref 21–32)
Calcium, Total: 8.3 mg/dL — ABNORMAL LOW (ref 8.5–10.1)
Chloride: 102 mmol/L (ref 98–107)
Creatinine: 0.92 mg/dL (ref 0.60–1.30)
EGFR (Non-African Amer.): >60
GLUCOSE: 109 mg/dL — AB (ref 65–99)
Osmolality: 268 (ref 275–301)
Potassium: 3.9 mmol/L (ref 3.5–5.1)
SGOT(AST): 54 U/L — ABNORMAL HIGH (ref 15–37)
SGPT (ALT): 89 U/L — ABNORMAL HIGH (ref 12–78)
Sodium: 134 mmol/L — ABNORMAL LOW (ref 136–145)
Total Protein: 6 g/dL — ABNORMAL LOW (ref 6.4–8.2)

## 2013-11-26 LAB — MAGNESIUM: Magnesium: 1.9 mg/dL

## 2013-11-27 LAB — CBC WITH DIFFERENTIAL/PLATELET
BASOS ABS: 0 10*3/uL (ref 0.0–0.1)
BASOS PCT: 0.3 %
EOS ABS: 0 10*3/uL (ref 0.0–0.7)
Eosinophil %: 0 %
HCT: 33.8 % — AB (ref 35.0–47.0)
HGB: 11.3 g/dL — ABNORMAL LOW (ref 12.0–16.0)
Lymphocyte #: 0.5 10*3/uL — ABNORMAL LOW (ref 1.0–3.6)
Lymphocyte %: 3.5 %
MCH: 30.4 pg (ref 26.0–34.0)
MCHC: 33.5 g/dL (ref 32.0–36.0)
MCV: 91 fL (ref 80–100)
Monocyte #: 0.4 x10 3/mm (ref 0.2–0.9)
Monocyte %: 3.2 %
NEUTROS ABS: 12.1 10*3/uL — AB (ref 1.4–6.5)
NEUTROS PCT: 93 %
Platelet: 158 10*3/uL (ref 150–440)
RBC: 3.73 10*6/uL — AB (ref 3.80–5.20)
RDW: 13.5 % (ref 11.5–14.5)
WBC: 13.1 10*3/uL — ABNORMAL HIGH (ref 3.6–11.0)

## 2013-11-30 LAB — PATHOLOGY REPORT

## 2013-12-20 ENCOUNTER — Other Ambulatory Visit: Payer: Self-pay

## 2013-12-20 DIAGNOSIS — Z1231 Encounter for screening mammogram for malignant neoplasm of breast: Secondary | ICD-10-CM

## 2013-12-21 ENCOUNTER — Encounter: Payer: Self-pay | Admitting: Family Medicine

## 2013-12-21 ENCOUNTER — Ambulatory Visit (INDEPENDENT_AMBULATORY_CARE_PROVIDER_SITE_OTHER): Payer: BC Managed Care – PPO | Admitting: Family Medicine

## 2013-12-21 VITALS — BP 126/84 | HR 64 | Temp 98.2°F | Ht 61.5 in | Wt 123.5 lb

## 2013-12-21 DIAGNOSIS — Z9089 Acquired absence of other organs: Secondary | ICD-10-CM

## 2013-12-21 DIAGNOSIS — H6980 Other specified disorders of Eustachian tube, unspecified ear: Secondary | ICD-10-CM

## 2013-12-21 DIAGNOSIS — H6990 Unspecified Eustachian tube disorder, unspecified ear: Secondary | ICD-10-CM

## 2013-12-21 DIAGNOSIS — Z9049 Acquired absence of other specified parts of digestive tract: Secondary | ICD-10-CM

## 2013-12-21 NOTE — Progress Notes (Signed)
Pre visit review using our clinic review tool, if applicable. No additional management support is needed unless otherwise documented below in the visit note. 

## 2013-12-21 NOTE — Patient Instructions (Signed)
I'm glad you are doing better ! If pain returns or any problems let me know Keep an eye on ear- if worse let me know - I think you have some inner ear congestion

## 2013-12-21 NOTE — Progress Notes (Signed)
Subjective:    Patient ID: Katherine Macdonald, female    DOB: 08-18-1956, 58 y.o.   MRN: [id number]  HPI Here for f/u after Bea Graff to [facility] ER 4/3 and was dx with gallstones and dilated biliary tree    (admitted 4/2 and d/c 4/4) She went in with bad ER pain  Did [place] and then CT  Per pt -also MRI that confirmed bile duct was clear - MRCP No liver test abn Had surgery 4/4 laparoscopically  D/c with a drain and had f/u with surgeon on 4/7  Was given tramadol for post op pain - took it for 2 days   Wt is up 1 lb   Feels fine -no problems and no pain now Back to work   Having some ear fullness on the L No pain Some allergy congestion No ear drainage or ha or fever   Patient Active Problem List   Diagnosis Date Noted  . 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 Chronic buttock pain 01/10/2012  . Low back pain 01/10/2012  . Routine general medical examination at a health care facility 01/02/2012  . Colon cancer screening 11/27/2010  . HEADACHE 07/06/2010  . HYPERCHOLESTEROLEMIA 01/30/2010  . UTERINE PROLAPSE 11/15/2008  . MENOPAUSAL SYNDROME 11/15/2008  . SLEEP DISORDER 12/25/2007  . CAROTID BRUIT, LEFT 10/06/2007  . HELICOBACTER PYLORI GASTRITIS 10/05/2007  . GERD 10/05/2007  . OVERACTIVE BLADDER 10/05/2007  . FIBROCYSTIC BREAST DISEASE 10/05/2007  . HYPERHIDROSIS 10/05/2007   Past Medical History  Diagnosis Date  . GERD (gastroesophageal reflux disease)   . Carotid bruit 09/2007    carotid doppler- normal  . Overactive bladder   . Sleep disorder   . Presence of pessary   . Female bladder prolapse    Past Surgical History  Procedure Laterality Date  . Bunionectomy      right foot  . Cholecystectomy     History  Substance Use Topics  . Smoking status: Never Smoker   . Smokeless tobacco: Not on file  . Alcohol Use: No   Family History  Problem Relation Age of Onset  . Arthritis Mother     Rheumatoid  . Heart disease Father     CABG  .  Hyperlipidemia Father   . Cancer Maternal Aunt     breast   Allergies  Allergen Reactions  . Codeine     REACTION: GI upset  . Sulfonamide Derivatives     REACTION: hives  . Zolpidem Tartrate     REACTION: ? headache   Current Outpatient Prescriptions on File Prior to Visit  Medication Sig Dispense Refill  . calcium carbonate (OS-CAL) 1250 MG chewable tablet Chew 1 tablet by mouth daily.      . Flaxseed, Linseed, 1000 MG CAPS Take 1 capsule by mouth 3 (three) times a week.       . Multiple Vitamin (MULTIVITAMIN) tablet Take 1 tablet by mouth daily.         No current facility-administered medications on file prior to visit.     Patient Active Problem List   Diagnosis Date Noted  . 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 Chronic buttock pain 01/10/2012  . Low back pain 01/10/2012  . Routine general medical examination at a health care facility 01/02/2012  . Colon cancer screening 11/27/2010  . HEADACHE 07/06/2010  . HYPERCHOLESTEROLEMIA 01/30/2010  . UTERINE PROLAPSE 11/15/2008  . MENOPAUSAL SYNDROME 11/15/2008  . SLEEP DISORDER 12/25/2007  . CAROTID BRUIT, LEFT 10/06/2007  .  HELICOBACTER PYLORI GASTRITIS 10/05/2007  . GERD 10/05/2007  . OVERACTIVE BLADDER 10/05/2007  . FIBROCYSTIC BREAST DISEASE 10/05/2007  . HYPERHIDROSIS 10/05/2007   Past Medical History  Diagnosis Date  . GERD (gastroesophageal reflux disease)   . Carotid bruit 09/2007    carotid doppler- normal  . Overactive bladder   . Sleep disorder   . Presence of pessary   . Female bladder prolapse    Past Surgical History  Procedure Laterality Date  . Bunionectomy      right foot  . Cholecystectomy     History  Substance Use Topics  . Smoking status: Never Smoker   . Smokeless tobacco: Not on file  . Alcohol Use: No   Family History  Problem Relation Age of Onset  . Arthritis Mother     Rheumatoid  . Heart disease Father     CABG  . Hyperlipidemia Father   . Cancer Maternal Aunt       breast   Allergies  Allergen Reactions  . Codeine     REACTION: GI upset  . Sulfonamide Derivatives     REACTION: hives  . Zolpidem Tartrate     REACTION: ? headache   Current Outpatient Prescriptions on File Prior to Visit  Medication Sig Dispense Refill  . calcium carbonate (OS-CAL) 1250 MG chewable tablet Chew 1 tablet by mouth daily.      . Flaxseed, Linseed, 1000 MG CAPS Take 1 capsule by mouth 3 (three) times a week.       . Multiple Vitamin (MULTIVITAMIN) tablet Take 1 tablet by mouth daily.         No current facility-administered medications on file prior to visit.    Review of Systems    Review of Systems  Constitutional: Negative for fever, appetite change, fatigue and unexpected weight change.  Eyes: Negative for pain and visual disturbance.  Respiratory: Negative for cough and shortness of breath.   Cardiovascular: Negative for cp or palpitations    Gastrointestinal: Negative for nausea, diarrhea and constipation.  Genitourinary: Negative for urgency and frequency.  Skin: Negative for pallor or rash   Neurological: Negative for weakness, light-headedness, numbness and headaches.  Hematological: Negative for adenopathy. Does not bruise/bleed easily.  Psychiatric/Behavioral: Negative for dysphoric mood. The patient is not nervous/anxious.      Objective:   Physical Exam  Constitutional: She appears well-nourished. No distress.  HENT:  Head: Normocephalic and atraumatic.  Right Ear: External ear normal.  Left Ear: External ear normal.  Mouth/Throat: Oropharynx is clear and moist.  Nares are boggy  Eyes: Conjunctivae and EOM are normal. Pupils are equal, round, and reactive to light. Right eye exhibits no discharge. Left eye exhibits no discharge.  Neck: Normal range of motion. Neck supple.  Cardiovascular: Normal rate and regular rhythm.   Pulmonary/Chest: Effort normal and breath sounds normal. No respiratory distress. She has no wheezes. She has no  rales.  No crackles   Abdominal: Soft. Bowel sounds are normal. She exhibits no distension and no mass. There is no tenderness. There is no rebound and no guarding.  Laparoscopy scars noted  Musculoskeletal: She exhibits no edema.  Neurological: She is alert.  Skin: Skin is dry. No rash noted. No pallor.  Psychiatric: She has a normal mood and affect.          Assessment & Plan:

## 2013-12-22 NOTE — Assessment & Plan Note (Signed)
ccy successful Rev hosp records in detail and labs/studies Doing very well post op  Nl exam  Will update if any problems

## 2013-12-22 NOTE — Assessment & Plan Note (Signed)
This is mild -pt chooses not to tx Suspect from all rhinitis Mentioned flonase if no imp

## 2014-01-10 ENCOUNTER — Encounter: Payer: Self-pay | Admitting: Family Medicine

## 2014-01-12 ENCOUNTER — Other Ambulatory Visit (INDEPENDENT_AMBULATORY_CARE_PROVIDER_SITE_OTHER): Payer: BC Managed Care – PPO

## 2014-01-12 ENCOUNTER — Telehealth: Payer: Self-pay | Admitting: Family Medicine

## 2014-01-12 DIAGNOSIS — Z0000 Encounter for general adult medical examination without abnormal findings: Secondary | ICD-10-CM

## 2014-01-12 DIAGNOSIS — Z1231 Encounter for screening mammogram for malignant neoplasm of breast: Secondary | ICD-10-CM

## 2014-01-12 LAB — CBC WITH DIFFERENTIAL/PLATELET
BASOS PCT: 0.6 % (ref 0.0–3.0)
Basophils Absolute: 0 10*3/uL (ref 0.0–0.1)
EOS PCT: 5.4 % — AB (ref 0.0–5.0)
Eosinophils Absolute: 0.2 10*3/uL (ref 0.0–0.7)
HEMATOCRIT: 38.7 % (ref 36.0–46.0)
Hemoglobin: 13 g/dL (ref 12.0–15.0)
LYMPHS ABS: 1 10*3/uL (ref 0.7–4.0)
Lymphocytes Relative: 30.1 % (ref 12.0–46.0)
MCHC: 33.5 g/dL (ref 30.0–36.0)
MCV: 91 fl (ref 78.0–100.0)
Monocytes Absolute: 0.3 10*3/uL (ref 0.1–1.0)
Monocytes Relative: 9.7 % (ref 3.0–12.0)
NEUTROS PCT: 54.2 % (ref 43.0–77.0)
Neutro Abs: 1.8 10*3/uL (ref 1.4–7.7)
Platelets: 261 10*3/uL (ref 150.0–400.0)
RBC: 4.25 Mil/uL (ref 3.87–5.11)
RDW: 13.8 % (ref 11.5–15.5)
WBC: 3.2 10*3/uL — AB (ref 4.0–10.5)

## 2014-01-12 LAB — COMPREHENSIVE METABOLIC PANEL
ALBUMIN: 4.1 g/dL (ref 3.5–5.2)
ALK PHOS: 71 U/L (ref 39–117)
ALT: 18 U/L (ref 0–35)
AST: 20 U/L (ref 0–37)
BUN: 16 mg/dL (ref 6–23)
CO2: 29 mEq/L (ref 19–32)
Calcium: 9.7 mg/dL (ref 8.4–10.5)
Chloride: 107 mEq/L (ref 96–112)
Creatinine, Ser: 0.9 mg/dL (ref 0.4–1.2)
GFR: 65.23 mL/min (ref >60.00)
GLUCOSE: 92 mg/dL (ref 70–99)
POTASSIUM: 5.1 meq/L (ref 3.5–5.1)
Sodium: 141 mEq/L (ref 135–145)
Total Bilirubin: 0.6 mg/dL (ref 0.2–1.2)
Total Protein: 6.9 g/dL (ref 6.0–8.3)

## 2014-01-12 LAB — LIPID PANEL
Cholesterol: 207 mg/dL — ABNORMAL HIGH (ref 0–200)
HDL: 54.3 mg/dL (ref >39.00)
LDL Cholesterol: 136 mg/dL — ABNORMAL HIGH (ref 0–99)
TRIGLYCERIDES: 85 mg/dL (ref 0.0–149.0)
Total CHOL/HDL Ratio: 4
VLDL: 17 mg/dL (ref 0.0–40.0)

## 2014-01-12 LAB — TSH: TSH: 1.56 u[IU]/mL (ref 0.35–4.50)

## 2014-01-12 NOTE — Telephone Encounter (Signed)
Future lab

## 2014-01-14 ENCOUNTER — Ambulatory Visit
Admission: RE | Admit: 2014-01-14 | Discharge: 2014-01-14 | Disposition: A | Discharge status: Home / Self Care | Payer: BC Managed Care – PPO | Source: Ambulatory Visit

## 2014-01-19 ENCOUNTER — Ambulatory Visit (INDEPENDENT_AMBULATORY_CARE_PROVIDER_SITE_OTHER): Payer: BC Managed Care – PPO | Admitting: Family Medicine

## 2014-01-19 ENCOUNTER — Encounter: Payer: Self-pay | Admitting: Family Medicine

## 2014-01-19 VITALS — BP 110/72 | HR 60 | Temp 98.6°F | Ht 61.34 in | Wt 123.0 lb

## 2014-01-19 DIAGNOSIS — E7800 Pure hypercholesterolemia, unspecified: Secondary | ICD-10-CM

## 2014-01-19 DIAGNOSIS — Z0000 Encounter for general adult medical examination without abnormal findings: Secondary | ICD-10-CM

## 2014-01-19 NOTE — Patient Instructions (Signed)
I'm glad you are doing well  Take care of yourself -keep up with good diet and exercise  Use sun protection -keep going to dermatologist  Watch diet for cholesterol    Avoid red meat/ fried foods/ egg yolks/ fatty breakfast meats/ butter, cheese and high fat dairy/ and shellfish    Fat and Cholesterol Control Diet Fat and cholesterol levels in your blood and organs are influenced by your diet. High levels of fat and cholesterol may lead to diseases of the heart, small and large blood vessels, gallbladder, liver, and pancreas. CONTROLLING FAT AND CHOLESTEROL WITH DIET Although exercise and lifestyle factors are important, your diet is key. That is because certain foods are known to raise cholesterol and others to lower it. The goal is to balance foods for their effect on cholesterol and more importantly, to replace saturated and trans fat with other types of fat, such as monounsaturated fat, polyunsaturated fat, and omega-3 fatty acids. On average, a person should consume no more than 15 to 17 g of saturated fat daily. Saturated and trans fats are considered "bad" fats, and they will raise LDL cholesterol. Saturated fats are primarily found in animal products such as meats, butter, and cream. However, that does not mean you need to give up all your favorite foods. Today, there are good tasting, low-fat, low-cholesterol substitutes for most of the things you like to eat. Choose low-fat or nonfat alternatives. Choose round or loin cuts of red meat. These types of cuts are lowest in fat and cholesterol. Chicken (without the skin), fish, veal, and ground [place] breast are great choices. Eliminate fatty meats, such as hot dogs and salami. Even shellfish have little or no saturated fat. Have a 3 oz (85 g) portion when you eat lean meat, poultry, or fish. Trans fats are also called "partially hydrogenated oils." They are oils that have been scientifically manipulated so that they are solid at room temperature  resulting in a longer shelf life and improved taste and texture of foods in which they are added. Trans fats are found in stick margarine, some tub margarines, cookies, crackers, and baked goods.  When baking and cooking, oils are a great substitute for butter. The monounsaturated oils are especially beneficial since it is believed they lower LDL and raise HDL. The oils you should avoid entirely are saturated tropical oils, such as coconut and palm.  Remember to eat a lot from food groups that are naturally free of saturated and trans fat, including fish, fruit, vegetables, beans, grains (barley, rice, couscous, bulgur wheat), and pasta (without cream sauces).  IDENTIFYING FOODS THAT LOWER FAT AND CHOLESTEROL  Soluble fiber may lower your cholesterol. This type of fiber is found in fruits such as apples, vegetables such as broccoli, potatoes, and carrots, legumes such as beans, peas, and lentils, and grains such as barley. Foods fortified with plant sterols (phytosterol) may also lower cholesterol. You should eat at least 2 g per day of these foods for a cholesterol lowering effect.  Read package labels to identify low-saturated fats, trans fat free, and low-fat foods at the supermarket. Select cheeses that have only 2 to 3 g saturated fat per ounce. Use a heart-healthy tub margarine that is free of trans fats or partially hydrogenated oil. When buying baked goods (cookies, crackers), avoid partially hydrogenated oils. Breads and muffins should be made from whole grains (whole-wheat or whole oat flour, instead of "flour" or "enriched flour"). Buy non-creamy canned soups with reduced salt and no added fats.  FOOD PREPARATION TECHNIQUES  Never deep-fry. If you must fry, either stir-fry, which uses very little fat, or use non-stick cooking sprays. When possible, broil, bake, or roast meats, and steam vegetables. Instead of putting butter or margarine on vegetables, use lemon and herbs, applesauce, and cinnamon  (for squash and sweet potatoes). Use nonfat yogurt, salsa, and low-fat dressings for salads.  LOW-SATURATED FAT / LOW-FAT FOOD SUBSTITUTES Meats / Saturated Fat (g)  Avoid: Steak, marbled (3 oz/85 g) / 11 g  Choose: Steak, lean (3 oz/85 g) / 4 g  Avoid: Hamburger (3 oz/85 g) / 7 g  Choose: Hamburger, lean (3 oz/85 g) / 5 g  Avoid: Ham (3 oz/85 g) / 6 g  Choose: Ham, lean cut (3 oz/85 g) / 2.4 g  Avoid: Chicken, with skin, dark meat (3 oz/85 g) / 4 g  Choose: Chicken, skin removed, dark meat (3 oz/85 g) / 2 g  Avoid: Chicken, with skin, light meat (3 oz/85 g) / 2.5 g  Choose: Chicken, skin removed, light meat (3 oz/85 g) / 1 g Dairy / Saturated Fat (g)  Avoid: Whole milk (1 cup) / 5 g  Choose: Low-fat milk, 2% (1 cup) / 3 g  Choose: Low-fat milk, 1% (1 cup) / 1.5 g  Choose: Skim milk (1 cup) / 0.3 g  Avoid: Hard cheese (1 oz/28 g) / 6 g  Choose: Skim milk cheese (1 oz/28 g) / 2 to 3 g  Avoid: Cottage cheese, 4% fat (1 cup) / 6.5 g  Choose: Low-fat cottage cheese, 1% fat (1 cup) / 1.5 g  Avoid: Ice cream (1 cup) / 9 g  Choose: Sherbet (1 cup) / 2.5 g  Choose: Nonfat frozen yogurt (1 cup) / 0.3 g  Choose: Frozen fruit bar / trace  Avoid: Whipped cream (1 tbs) / 3.5 g  Choose: Nondairy whipped topping (1 tbs) / 1 g Condiments / Saturated Fat (g)  Avoid: Mayonnaise (1 tbs) / 2 g  Choose: Low-fat mayonnaise (1 tbs) / 1 g  Avoid: Butter (1 tbs) / 7 g  Choose: Extra light margarine (1 tbs) / 1 g  Avoid: Coconut oil (1 tbs) / 11.8 g  Choose: Olive oil (1 tbs) / 1.8 g  Choose: Corn oil (1 tbs) / 1.7 g  Choose: Safflower oil (1 tbs) / 1.2 g  Choose: Sunflower oil (1 tbs) / 1.4 g  Choose: Soybean oil (1 tbs) / 2.4 g  Choose: Canola oil (1 tbs) / 1 g Document Released: 08/12/2005 Document Revised: 12/07/2012 Document Reviewed: 01/31/2011 Exit[employer] Patient Information 2014 [place], [place].

## 2014-01-19 NOTE — Progress Notes (Signed)
Subjective:    Patient ID: Katherine Macdonald, female    DOB: 1956-02-18, 58 y.o.   MRN: [id number]  HPI Here for health maintenance exam and to review chronic medical problems    Doing well overall  Nothing new going on   Wt is stable bmi 22  She does eat very healthy most of the time  Also exercises -she likes to run   Last gyn exam and pap smear after the last visit  6/14 normal  She is looking for a new gyn - she wants to find a new  She has a pessary - has had it for years and does well with it   Flu vaccine in the fall 11/06 Td  Colonoscopy 8/14 -normal with 10 year recall   Results for orders placed in visit on 01/12/14  CBC WITH DIFFERENTIAL      Result Value Ref Range   WBC 3.2 (*) 4.0 - 10.5 K/uL   RBC 4.25  3.87 - 5.11 Mil/uL   Hemoglobin 13.0  12.0 - 15.0 g/dL   HCT 38.7  36.0 - 46.0 %   MCV 91.0  78.0 - 100.0 fl   MCHC 33.5  30.0 - 36.0 g/dL   RDW 13.8  11.5 - 15.5 %   Platelets 261.0  150.0 - 400.0 K/uL   Neutrophils Relative % 54.2  43.0 - 77.0 %   Lymphocytes Relative 30.1  12.0 - 46.0 %   Monocytes Relative 9.7  3.0 - 12.0 %   Eosinophils Relative 5.4 (*) 0.0 - 5.0 %   Basophils Relative 0.6  0.0 - 3.0 %   Neutro Abs 1.8  1.4 - 7.7 K/uL   Lymphs Abs 1.0  0.7 - 4.0 K/uL   Monocytes Absolute 0.3  0.1 - 1.0 K/uL   Eosinophils Absolute 0.2  0.0 - 0.7 K/uL   Basophils Absolute 0.0  0.0 - 0.1 K/uL  COMPREHENSIVE METABOLIC PANEL      Result Value Ref Range   Sodium 141  135 - 145 mEq/L   Potassium 5.1  3.5 - 5.1 mEq/L   Chloride 107  96 - 112 mEq/L   CO2 29  19 - 32 mEq/L   Glucose, Bld 92  70 - 99 mg/dL   BUN 16  6 - 23 mg/dL   Creatinine, Ser 0.9  0.4 - 1.2 mg/dL   Total Bilirubin 0.6  0.2 - 1.2 mg/dL   Alkaline Phosphatase 71  39 - 117 U/L   AST 20  0 - 37 U/L   ALT 18  0 - 35 U/L   Total Protein 6.9  6.0 - 8.3 g/dL   Albumin 4.1  3.5 - 5.2 g/dL   Calcium 9.7  8.4 - 10.5 mg/dL   GFR 65.23  >60.00 mL/min  TSH      Result Value Ref Range   TSH 1.56  0.35 - 4.50 uIU/mL  LIPID PANEL      Result Value Ref Range   Cholesterol 207 (*) 0 - 200 mg/dL   Triglycerides 85.0  0.0 - 149.0 mg/dL   HDL 54.30  >39.00 mg/dL   VLDL 17.0  0.0 - 40.0 mg/dL   LDL Cholesterol 136 (*) 0 - 99 mg/dL   Total CHOL/HDL Ratio 4     LDL is a little high but HDL is good  Does not eat fatty foods often at all - she does watch her diet   Patient Active Problem List   Diagnosis Date  Noted  . History of cholecystectomy 12/21/2013  . ETD (eustachian tube dysfunction) 12/21/2013  . 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 Chronic buttock pain 01/10/2012  . Low back pain 01/10/2012  . Routine general medical examination at a health care facility 01/02/2012  . Colon cancer screening 11/27/2010  . HEADACHE 07/06/2010  . HYPERCHOLESTEROLEMIA 01/30/2010  . UTERINE PROLAPSE 11/15/2008  . MENOPAUSAL SYNDROME 11/15/2008  . SLEEP DISORDER 12/25/2007  . CAROTID BRUIT, LEFT 10/06/2007  . HELICOBACTER PYLORI GASTRITIS 10/05/2007  . GERD 10/05/2007  . OVERACTIVE BLADDER 10/05/2007  . FIBROCYSTIC BREAST DISEASE 10/05/2007  . HYPERHIDROSIS 10/05/2007   Past Medical History  Diagnosis Date  . GERD (gastroesophageal reflux disease)   . Carotid bruit 09/2007    carotid doppler- normal  . Overactive bladder   . Sleep disorder   . Presence of pessary   . Female bladder prolapse    Past Surgical History  Procedure Laterality Date  . Bunionectomy      right foot  . Cholecystectomy     History  Substance Use Topics  . Smoking status: Never Smoker   . Smokeless tobacco: Never Used  . Alcohol Use: No   Family History  Problem Relation Age of Onset  . Arthritis Mother     Rheumatoid  . Heart disease Father     CABG  . Hyperlipidemia Father   . Cancer Maternal Aunt     breast   Allergies  Allergen Reactions  . Codeine     REACTION: GI upset  . Sulfonamide Derivatives     REACTION: hives  . Zolpidem Tartrate     REACTION: ? headache    Current Outpatient Prescriptions on File Prior to Visit  Medication Sig Dispense Refill  . Multiple Vitamin (MULTIVITAMIN) tablet Take 1 tablet by mouth daily.         No current facility-administered medications on file prior to visit.    Review of Systems Review of Systems  Constitutional: Negative for fever, appetite change, fatigue and unexpected weight change.  Eyes: Negative for pain and visual disturbance.  Respiratory: Negative for cough and shortness of breath.   Cardiovascular: Negative for cp or palpitations    Gastrointestinal: Negative for nausea, diarrhea and constipation.  Genitourinary: Negative for urgency and frequency.  Skin: Negative for pallor or rash   Neurological: Negative for weakness, light-headedness, numbness and headaches.  Hematological: Negative for adenopathy. Does not bruise/bleed easily.  Psychiatric/Behavioral: Negative for dysphoric mood. The patient is not nervous/anxious.         Objective:   Physical Exam  Constitutional: She appears well-developed and well-nourished. No distress.  HENT:  Head: Normocephalic and atraumatic.  Right Ear: External ear normal.  Left Ear: External ear normal.  Nose: Nose normal.  Mouth/Throat: Oropharynx is clear and moist.  Eyes: Conjunctivae and EOM are normal. Pupils are equal, round, and reactive to light. Right eye exhibits no discharge. Left eye exhibits no discharge. No scleral icterus.  Neck: Normal range of motion. Neck supple. No JVD present. Carotid bruit is present. No thyromegaly present.  Cardiovascular: Normal rate, regular rhythm, normal heart sounds and intact distal pulses.  Exam reveals no gallop.   Pulmonary/Chest: Effort normal and breath sounds normal. No respiratory distress. She has no wheezes. She has no rales.  Abdominal: Soft. Bowel sounds are normal. She exhibits no distension and no mass. There is no tenderness.  Musculoskeletal: She exhibits no edema and no tenderness.   Lymphadenopathy:    She has no  cervical adenopathy.  Neurological: She is alert. She has normal reflexes. No cranial nerve deficit. She exhibits normal muscle tone. Coordination normal.  Skin: Skin is warm and dry. No rash noted. No erythema. No pallor.  Psychiatric: She has a normal mood and affect.          Assessment & Plan:

## 2014-01-19 NOTE — Progress Notes (Signed)
Pre visit review using our clinic review tool, if applicable. No additional management support is needed unless otherwise documented below in the visit note. 

## 2014-01-20 NOTE — Assessment & Plan Note (Signed)
Reviewed health habits including diet and exercise and skin cancer prevention Reviewed appropriate screening tests for age  Also reviewed health mt list, fam hx and immunization status , as well as social and family history   Labs reviewed  

## 2014-01-20 NOTE — Assessment & Plan Note (Signed)
Disc goals for lipids and reasons to control them Rev labs with pt Rev low sat fat diet in detail   

## 2014-06-10 ENCOUNTER — Other Ambulatory Visit: Payer: Self-pay

## 2014-12-17 NOTE — Discharge Summary (Signed)
PATIENT NAME:  Katherine Macdonald, Katherine Macdonald MR#:  [id number] DATE OF BIRTH:  [birth year]  DATE OF ADMISSION:  11/25/2013 DATE OF DISCHARGE:  11/27/2013  PRIMARY CARE PHYSICIAN:  Dr. Glori Bickers.  DISCHARGE DIAGNOSES:  Cholelithiasis with cholecystitis.    PROCEDURE:  Laparoscopic with cholecystectomy.  CONDITION:  Stable.   CODE STATUS:  FULL CODE.   HOME MEDICATIONS:  Tramadol 50 mg by mouth every six hours as needed for two days.   DIET:  Low fat, low cholesterol diet.   ACTIVITY:  As tolerated.   FOLLOW-UP CARE:  Follow with PCP within 2 to 4 weeks.  Follow up with Dr. Marina Gravel within 1 to 2 weeks.   REASON FOR ADMISSION:  Abdominal pain, nausea, vomiting.   HOSPITAL COURSE:  The patient is a 59 year old Caucasian female with a history of hyperlipidemia who presented in the ED with epigastric pain which is burning, constant, radiation to the back, associated with nausea, vomiting.  The patient got an abdominal ultrasound and a CAT scan which showed gallbladder stone and a possible CBD stone.  For a detailed history and physical examination, please refer to the admission note dictated by me.  On the admission date the patient has WBC 11.5, hemoglobin 13.5, platelets 217.  Urinalysis is negative.  Lipase of 206.  Renal function is normal.  After admission, the patient was kept nothing by mouth with IV fluid support, Zofran as needed, pain control.  We requested a GI consult.  Dr. Rayann Heman evaluated the patient and suggested MRCP which showed cholelithiasis and cholecystitis without CBD stone, so I requested a surgical consult.  Dr. Marina Gravel evaluated the patient and suggested surgery.  The patient got a laparoscopy with a cholecystectomy today.  After admission, the patient only has abdominal sore.  She started a clear liquid diet without problems.  Dr. Marina Gravel suggested the patient can be discharged to home today.  The patient's vital signs are stable.  Physical examination is unremarkable.  She is clinically stable, will  be discharged to home today.  I discussed the patient's discharge plan with the patient, the patient's husband, nurse and Dr. Marina Gravel.   TIME SPENT:  About 38 minutes.    ____________________________ Demetrios Loll, MD qc:ea D: 11/27/2013 16:03:47 ET T: 11/27/2013 17:58:34 ET JOB#: [id number]  cc: Demetrios Loll, MD, <Dictator> Demetrios Loll MD ELECTRONICALLY SIGNED 11/28/2013 14:07

## 2014-12-17 NOTE — Consult Note (Signed)
Brief Consult Note: Diagnosis: acute calculus cholecystitis, probable hydrops.   Patient was seen by consultant.   Recommend to proceed with surgery or procedure.   Orders entered.   Comments: plan cholecystectomy in am discussed with patient and family procedure and all questions addressed..  Electronic Signatures: Sherri Rad (MD)  (Signed 03-Apr-15 17:59)  Authored: Brief Consult Note   Last Updated: 03-Apr-15 17:59 by Sherri Rad (MD)

## 2014-12-17 NOTE — H&P (Signed)
PATIENT NAME:  Katherine Macdonald, Katherine Macdonald MR#:  [id number] DATE OF BIRTH:  August 15, 1956  DATE OF ADMISSION:  11/25/2013  PRIMARY CARE PHYSICIAN: Dr. Glori Bickers.  REFERRING PHYSICIAN:  Dr. Corky Downs.   CHIEF COMPLAINT: Abdominal pain, nausea, vomiting since yesterday.   HISTORY OF PRESENT ILLNESS: A 59 year old Caucasian female with a history of hyperlipidemia, presented to ED with epigastric pain , which is burning constant, radiation to back,  associated with nausea, vomiting, since last night. The patient denies any fever or chills. No diarrhea, melena, or bloody stool. Denies any alcohol drinking or eating fatty food. Denies any jaundice or dark urine. The patient got an abdominal ultrasound and a CT scan, which show gallbladder stone and possible CBD stone.   PAST MEDICAL HISTORY: Hyperlipidemia.   PAST SURGICAL HISTORY: Right foot surgery.   SOCIAL HISTORY: No smoking or drinking or illicit drugs.   FAMILY HISTORY: Father had hypertension.   HOME MEDICATIONS: Patient denies any home medications except vitamin.    ALLERGIES:  CODEINE, MELOXICAM, SULFA.   REVIEW OF SYSTEMS:   CONSTITUTIONAL: The patient denies any fever or chills. No headache or dizziness. No weakness.  EYES: No double vision. No blurry vision.  ENT: No postnasal drip, slurred speech or dysphagia.  CARDIOVASCULAR: No chest pain, palpitation, orthopnea, or nocturnal dyspnea. No leg edema.  PULMONARY: No cough, sputum, shortness of breath or hemoptysis.  GASTROINTESTINAL: Positive for abdominal pain, nausea, vomiting, but no diarrhea, melena, or bloody stool.  GENITOURINARY: No dysuria, hematuria, or incontinence.  SKIN: No rash or jaundice.  NEUROLOGY: No syncope, loss of consciousness or seizure.  ENDOCRINE: No polyuria, polydipsia, heat or cold intolerance.  SKIN: No rash or jaundice.  HEMATOLOGIC: No easy bruising or bleeding.   PHYSICAL EXAMINATION:  VITAL SIGNS: Temperature 98.4, blood pressure 111/68, pulse 58,  respirations 12, oxygen saturation 95% on room air.  GENERAL: Alert, awake, oriented, in no acute distress.  EYES: Pupils round, equal and react in accommodation.   NECK: Supple. No JVD or carotid bruits. No lymphadenopathy. No thyromegaly.  CARDIOVASCULAR: S1 and S2. Regular rate and rhythm. No murmurs, gallops.  PULMONARY: Bilateral air entry. No wheezing or rales. No use of accessory muscle to breathe.  ABDOMEN: Soft. No distention, but has tenderness in epigastric area. No organomegaly. Bowel sounds present.  EXTREMITIES: No edema, clubbing, or cyanosis. No calf tenderness. Bilateral pedal pulses present.  SKIN: No rash or jaundice.  NEUROLOGIC: Alert and oriented x 3. No focal deficit. Power 5/5. Sensation intact.   LABORATORY DATA: Ultrasound of abdomen shows small stones in the gallbladder, sludge without evidence of cholecystitis. Bile duct to common bile duct is dilated at 1 cm in diameter. CT scan of abdomen and pelvis show acute biliary pathology with dilated biliary tree and constellation of findings that suggest the possibility of low-density stones in gallbladder neck and bile duct. Also has a possibility of cholecystitis.   Urinalysis is negative. WBC 11.5, hemoglobin 13.5, platelets 217. Glucose 154. BUN 15, creatinine 0.95. Electrolytes normal. Lipase 206.   IMPRESSIONS:  1.  Cholelithiasis with possible cholecystitis.  2.  Choledocholithiasis.  3.  Hyperlipidemia.   PLAN OF TREATMENT:  1.  The patient will be admitted to the medical floor. We will keep n.p.o., IV fluid support, GI consult, possible ERCP.  2.  Pain control with dilaudid p.r.n. and  Zofran p.r.n. We will start Rocephin for cholecystitis and follow up CBC. 3.  I discussed the patient's condition and plan of treatment with the patient and the  patient's husband.   CODE STATUS:  The patient is full code.   TIME SPENT: About 55 minutes.   ____________________________ Demetrios Loll, MD qc:tc D: 11/25/2013  14:21:56 ET T: 11/25/2013 16:13:05 ET JOB#: [id number]  cc: Demetrios Loll, MD, <Dictator> Demetrios Loll MD ELECTRONICALLY SIGNED 11/25/2013 18:07

## 2014-12-17 NOTE — Consult Note (Signed)
PATIENT NAME:  Katherine Macdonald, Katherine Macdonald MR#:  [id number] DATE OF BIRTH:  [birth year]  DATE OF CONSULTATION:  11/25/2013  REFERRING PHYSICIAN:  Dr. Bridgett Larsson CONSULTING PHYSICIAN:  Arther Dames, MD / Corky Sox. Jolanta Cabeza, PA-C  REASON FOR CONSULTATION: Epigastric abdominal pain with gallstones.   HISTORY OF PRESENT ILLNESS: This is a pleasant 59 year old woman who initially presented to the Emergency Room with acute onset epigastric abdominal pain, nausea and vomiting beginning yesterday at 6:00 p.m. She has never experienced pain quite to this degree before or this long lasting. Upon further work-up, she underwent a CT scan of the abdomen and pelvis notable for a slightly prominent pancreatic duct with a common bile duct of 13 mm. There was some concerns for biliary disease and therefore a follow-up ultrasound was obtained. The ultrasound was notable for small stones and gallbladder sludge, but no evidence of cholecystitis and negative sonographic Murphy's sign. Her common bile duct was dilated at 1 cm, and there was no clear stone identified within the duct. Of note, her white blood cell count is only very mildly elevated at 11.5 and otherwise liver enzymes are all normal. Lipase is normal as well. Since being admitted, the patient has been given Protonix, pain control and nausea medications. She feels very slightly nauseated but has not seen any further vomiting since last night. She does continue to have epigastric abdominal pain, which unfortunately has remained unchanged in severity. She has been kept n.p.o. today and started on IV fluids, but she admits that she does not have much of an appetite regardless. Last night the pain was radiating to her back. Today she is denying any back pain. No black or bloody stools. She had a normal bowel movement this morning and there is no diarrhea, constipation, bright red blood per rectum or melena. No hematemesis. No recent travel or sick contacts. No fever or chills. No  unintentional weight changes. The patient recently underwent a colonoscopy, but admits that she has never had an EGD.   PAST MEDICAL HISTORY: Diet controlled dyslipidemia.   HOME MEDICATIONS: Vitamins.   ALLERGIES: SULFA, CODEINE AND MELOXICAM.   PAST SURGICAL HISTORY: Right foot surgery and bunionectomy.   FAMILY HISTORY: No known family history of GI malignancy, colon polyps or IBD.   SOCIAL HISTORY: The patient denies any alcohol, tobacco or illicit drug use. No recent travel or sick contacts.   REVIEW OF SYSTEMS: A 10 system review was obtained on the patient. Pertinent positives are mentioned above and otherwise negative.   PHYSICAL EXAMINATION: VITAL SIGNS: Blood pressure 111/68, heart rate 58, respirations 12, temp 98.4, bedside pulse ox 95% on room air.  GENERAL: This is a pleasant 60 year old female accompanied by her daughters and husband at bedside, in no acute distress, alert and oriented x3. She does seem somewhat drowsy, likely secondary to the pain medications.  HEAD: Atraumatic, normocephalic.  NECK: Supple. No lymphadenopathy noted.  HENT: Sclerae anicteric. Mucous membranes moist.  LUNGS: Respirations are even and unlabored. Clear to auscultation in bilateral anterior lung fields.  HEART: Regular rate and rhythm. S1, S2 noted.  ABDOMEN: Soft, nondistended. Positive tenderness to palpation is noted in the epigastric region. No guarding or rebound. No signs of an acute abdomen. No masses, hernias or organomegaly appreciated.  RECTAL: Deferred.  PSYCHIATRIC: Appropriate mood and affect.  NEUROLOGIC: Cranial nerves II through XII are grossly intact.   DIAGNOSTIC DATA: Laboratory: White blood cells 11.5, hemoglobin 13.5, hematocrit 41.1, platelets 217,000. Sodium 135, potassium 3.6, BUN 15, creatinine 0.95,  glucose 154, bilirubin 0.5, alk phos 96, ALT 25, AST 24. Troponins were negative. MCV is 91. Lipase is 203.   Imaging: A CT scan of the abdomen and pelvis was obtained  on the patient showing slight prominence of the pancreatic duct. Common bile duct was 13 mm and there was concern for possible underlying biliary disease. Correlation with LFTs was recommended  and her liver panel was normal.   Ultrasound of the abdomen was obtained on the patient showing small stones and gallbladder sludge. No evidence of cholecystitis. Negative sonographic Murphy's sign. Common bile duct was 1 cm with no clear stone within the duct identified.   ASSESSMENT: 1.  Abnormal imaging, notable for gallbladder stones and sludge with a dilated common bile duct of 1 cm.  2.  Acute onset epigastric abdominal pain.  3.  Acute onset nausea and vomiting. She is somewhat nauseous today, but has not had any further vomiting since yesterday.   PLAN: I have discussed this patient's case in detail with Dr. Arther Dames who is involved in the development of the patient's plan of care. We did review the patient's imaging and lab work and it is not entirely clear whether or not she has passed a gallstone or if she has a retained stone due to normal liver enzymes. Therefore, to rule out any sort of biliary obstruction that would cause the dilation and abdominal pain, we would like to patient undergo a MRCP to take a closer look. Continue pain medication, Protonix and nausea medication as needed. We will continue to monitor this patient throughout hospitalization and make further recommendations pending the MRCP findings and per clinical course.   Thank you so much for this consultation and for allowing [place] to participate in the patient's plan of care.   This services provided by Corky Sox. Jackelyne Sayer, PA-C under collaborative agreement with Dr. Arther Dames.   ____________________________ Corky Sox. Horace Wishon, PA-C kme:sb D: 11/25/2013 14:13:52 ET T: 11/25/2013 16:07:54 ET JOB#: [id number]  cc: Corky Sox. Airen Stiehl, PA-C, <Dictator> [place] PA ELECTRONICALLY SIGNED 11/25/2013 16:44

## 2014-12-17 NOTE — Consult Note (Signed)
Brief Consult Note: Diagnosis: epigastric abdominal pain, cholelithiasis.   Patient was seen by consultant.   Consult note dictated.   Orders entered.   Discussed with Attending MD.   Comments: Patient seen and evaluated. Acute onset epigastric pain radiating to the back, with n/v. no further emesis today but pain persists. Imaging reveals cholelithiasis and GB sludge with a dilated CBD. May have passed a stone but unclear if there is still choledocholithiasis. Will check an MRCP to evaluate further. Recheck LFTs in the am. Continue antiemetics, PPI, and pain control PRN. Will follow.   full consult being dictated.  Electronic Signatures: Sherald Barge (PA-C)  (Signed 02-Apr-15 14:16)  Authored: Brief Consult Note   Last Updated: 02-Apr-15 14:16 by Sherald Barge (PA-C)

## 2014-12-17 NOTE — Consult Note (Signed)
Details:   - I have seen and examined Ms Scardina and agree with [place] earle's a/p.   Epi abd pain with gallstones and  mild dil CBD.  Will obtain MRCP to r/o CBD stone but would be unusual with nml liver enzymes.   Will need surgical consult re: cholecystectomy.   Electronic Signatures: Arther Dames (MD)  (Signed 02-Apr-15 18:01)  Authored: Details   Last Updated: 02-Apr-15 18:01 by Arther Dames (MD)

## 2014-12-17 NOTE — Consult Note (Signed)
PATIENT NAME:  Katherine Macdonald, Katherine Macdonald MR#:  [id number] DATE OF BIRTH:  1955-11-19  DATE OF CONSULTATION:  11/26/2013  REFERRING PHYSICIAN:   CONSULTING PHYSICIAN:  Araf Clugston A. Marina Gravel, MD  HISTORY: This is a 59 year old white female admitted to the medical service yesterday with approximately a 48-hour history of severe epigastric abdominal pain, nausea, some vomiting but no diarrhea. She has never had pain like this before. She attributed it initially to long-standing reflux. She tried to treat this, but it did not get better. She was, therefore, admitted to the hospital under the medical service. Imaging and labs were obtained in the Emergency Room. Her liver function tests were found to be normal. White count was 11.5. Electrolytes were unremarkable. Lipase was 203. Imaging consisted of an ultrasound demonstrating a bile duct measuring 10 mm. There was no pericholecystic fluid noted, with some sludge and several gallstones measuring up to 1.1 cm in size. CT scan of the abdomen and pelvis was performed which I personally reviewed, demonstrating what appeared to be a dilated biliary tree, stone seen within the gallbladder neck and common bile duct dilatation. MRCP was subsequently performed, which I did interpret today, which demonstrated no evidence of choledocholithiasis. There were 1 cm stones x 2 within the cystic duct and pericholecystic fluid seen.   ALLERGIES: CODEINE, MELOXICAM, SULFA DRUGS.   HOME MEDICATIONS: Include vitamins.   PAST MEDICAL HISTORY: Hyperlipidemia.   PAST SURGICAL HISTORY: Right foot surgery. No abdominal surgery.   FAMILY HISTORY: Negative for cholelithiasis. Positive for hypertension.   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 Her husband is present. No smoking. No drinking. No drug use.   PHYSICAL EXAMINATION:  GENERAL: The patient is ill appearing. Family is at bedside.  VITAL SIGNS: Temperature 99.2, pulse of 75, respiratory rate of  18, blood pressure is 107/63, room air saturation is 94%.  EYES: Sclerae are anicteric.  LUNGS: Clear.  HEART: Regular rate and rhythm.  ABDOMEN: Demonstrates very significant tenderness in the right upper quadrant to even light palpation consistent with a positive Murphy sign. No scars. No hernias were present.  EXTREMITIES: Warm and well perfused.  NEUROLOGIC AND PSYCHIATRIC: Unremarkable. She is alert and oriented x 4 with appropriate mood, judgment and affect.  SKIN: Demonstrates no rash. No jaundice.  HEAD AND NECK: Unremarkable. Faces are symmetrical.   LABORATORY VALUES: As described above.   IMAGING: As described above.   IMPRESSION: A 59 year old white female with acute calculus cholecystitis and likely evolving hydrops.   PLAN: Continue IV antibiotics. Plan for laparoscopic cholecystectomy in the morning. I discussed with her the risks, benefits and alternatives of surgery, including that of bleeding, infection, need for conversion to open operation, the 1 in 200 chance of bile duct injury, bile duct leak. All of her questions were answered.   TOTAL TIME SPENT: 60 minutes.   ____________________________ Jeannette How Marina Gravel, MD mab:gb D: 11/26/2013 18:31:38 ET T: 11/26/2013 20:16:22 ET JOB#: [id number]  cc: Elta [place] A. Marina Gravel, MD, <Dictator> [place] A. Glori Bickers, MD Elta Guadeloupe Bettina Gavia MD ELECTRONICALLY SIGNED 11/30/2013 13:14

## 2014-12-17 NOTE — Op Note (Signed)
PATIENT NAME:  Katherine Macdonald, Katherine Macdonald MR#:  [id number] DATE OF BIRTH:  14-Nov-1955  DATE OF PROCEDURE:  11/27/2013  PREOPERATIVE DIAGNOSIS: Acute cholecystitis.   POSTOPERATIVE DIAGNOSIS: Acute cholecystitis.   PROCEDURE PERFORMED: Laparoscopic cholecystectomy.   SURGEON: Arena Lindahl A. Marina Gravel, MD   ASSISTANT: Scrub tech  ANESTHESIA: General endotracheal.   FINDINGS: Acute cholecystitis.   SPECIMENS: Gallbladder with contents.   ESTIMATED BLOOD LOSS: 100 mL.   DRAINS: Jackson-Pratt x 1, gallbladder fossa.   DESCRIPTION OF PROCEDURE: With informed consent, supine position, and general endotracheal anesthesia, the patient's abdomen was widely clipped of hair, prepped and draped with ChloraPrep solution. Timeout was observed. A 12 mm blunt Hasson trocar was placed through an open technique through an infraumbilical transversely oriented skin incision with stay sutures being passed through the fascia. Pneumoperitoneum was established. The patient was then positioned in reverse Trendelenburg and airplane right side up. A 5 mm bladeless trocar was placed in the epigastric region. Two 5 mm ports were placed in the right subcostal margin. The gallbladder was aspirated of approximately 60 mL of thick bile allowing grasper. The gallbladder wall was markedly thickened and edematous. An inflammatory rind was then taken off the gallbladder body with blunt technique. This consisted of omentum and stomach.   Lateral traction was achieved on Hartmann's pouch. The hepatoduodenal ligament was then incised, exposing a cystic duct and cystic artery with small lymphatic branch. Critical view of safety cholecystectomy was achieved. The cystic duct was triply clipped on the portal side, singly clipped on the gallbladder side and divided. The cystic artery was doubly clipped on the portal side, singly clipped on the gallbladder side and divided. Small lymphatic was taken between single hemoclips. The gallbladder at this point then  essentially peeled off the  gallbladder fossa, was captured in an Endo Catch device and retrieved. In the process of retrieving, a 5 mm surgical telescope was inserted into the epigastric port, demonstrating no evidence of bowel injury at the trocar insertion site at the umbilicus. With pneumoperitoneum then re-established, the right upper quadrant was irrigated with a total of 2 liters of normal saline and aspirated dry. Point hemostasis was obtained in the gallbladder fossa with electrocautery and the application of 10 mL of Surgiflo with thrombin. A 19 mm Blake drain was directed into the site and exited the lowermost right upper quadrant port site, securing at the exit site with nylon suture. Drain was placed into Morison's pouch. The patient was then returned supine. Ports were then removed under direct visualization. The infraumbilical fascial defect was closed with multiple interrupted figure-of-eight and vertically oriented #0 Vicryl sutures. A total of 30 mL of 0.25% plain Marcaine was infiltrated along all skin and fascial incisions prior to closure. A 4-0 Vicryl subcuticular was applied to all skin edges, followed by the application of benzoin, Steri-Strips, Telfa and Tegaderm. The patient was then subsequently extubated and taken to the recovery room condition in stable and satisfactory condition by anesthesia services.     ____________________________ Jeannette How Marina Gravel, MD mab:jcm D: 11/27/2013 14:12:28 ET T: 11/27/2013 17:05:31 ET JOB#: [id number]  cc: Elta Gu[place] A. Marina Gravel, MD, <Dictator> Hortencia Conradi MD ELECTRONICALLY SIGNED 11/30/2013 13:15

## 2015-01-09 ENCOUNTER — Other Ambulatory Visit: Payer: Self-pay

## 2015-01-09 DIAGNOSIS — Z1231 Encounter for screening mammogram for malignant neoplasm of breast: Secondary | ICD-10-CM

## 2015-01-16 ENCOUNTER — Telehealth: Payer: Self-pay | Admitting: Family Medicine

## 2015-01-16 DIAGNOSIS — Z0000 Encounter for general adult medical examination without abnormal findings: Secondary | ICD-10-CM

## 2015-01-16 NOTE — Telephone Encounter (Signed)
-----   Message from Marchia Bond sent at 01/16/2015  2:05 PM EDT ----- Regarding: cpx labs tomorrow 5/24, need orders please :-) Please order  future cpx labs for pt's upcoming lab appt. Thanks Aniceto Boss

## 2015-01-17 ENCOUNTER — Other Ambulatory Visit (INDEPENDENT_AMBULATORY_CARE_PROVIDER_SITE_OTHER): Payer: BLUE CROSS/BLUE SHIELD

## 2015-01-17 DIAGNOSIS — Z0000 Encounter for general adult medical examination without abnormal findings: Secondary | ICD-10-CM

## 2015-01-17 LAB — CBC WITH DIFFERENTIAL/PLATELET
BASOS ABS: 0 10*3/uL (ref 0.0–0.1)
BASOS PCT: 0.8 % (ref 0.0–3.0)
Eosinophils Absolute: 0.2 10*3/uL (ref 0.0–0.7)
Eosinophils Relative: 5.5 % — ABNORMAL HIGH (ref 0.0–5.0)
HCT: 40.5 % (ref 36.0–46.0)
Hemoglobin: 13.7 g/dL (ref 12.0–15.0)
Lymphocytes Relative: 32.7 % (ref 12.0–46.0)
Lymphs Abs: 1 10*3/uL (ref 0.7–4.0)
MCHC: 33.8 g/dL (ref 30.0–36.0)
MCV: 89.7 fl (ref 78.0–100.0)
MONO ABS: 0.4 10*3/uL (ref 0.1–1.0)
MONOS PCT: 12.2 % — AB (ref 3.0–12.0)
Neutro Abs: 1.5 10*3/uL (ref 1.4–7.7)
Neutrophils Relative %: 48.8 % (ref 43.0–77.0)
PLATELETS: 233 10*3/uL (ref 150.0–400.0)
RBC: 4.52 Mil/uL (ref 3.87–5.11)
RDW: 13.7 % (ref 11.5–15.5)
WBC: 3.1 10*3/uL — AB (ref 4.0–10.5)

## 2015-01-17 LAB — COMPREHENSIVE METABOLIC PANEL
ALT: 34 U/L (ref 0–35)
AST: 25 U/L (ref 0–37)
Albumin: 4.3 g/dL (ref 3.5–5.2)
Alkaline Phosphatase: 83 U/L (ref 39–117)
BUN: 14 mg/dL (ref 6–23)
CO2: 31 meq/L (ref 19–32)
Calcium: 9.7 mg/dL (ref 8.4–10.5)
Chloride: 104 mEq/L (ref 96–112)
Creatinine, Ser: 0.93 mg/dL (ref 0.40–1.20)
GFR: 65.81 mL/min (ref >60.00)
Glucose, Bld: 94 mg/dL (ref 70–99)
Potassium: 4.9 mEq/L (ref 3.5–5.1)
Sodium: 138 mEq/L (ref 135–145)
Total Bilirubin: 0.8 mg/dL (ref 0.2–1.2)
Total Protein: 7 g/dL (ref 6.0–8.3)

## 2015-01-17 LAB — LIPID PANEL
CHOL/HDL RATIO: 4
CHOLESTEROL: 189 mg/dL (ref 0–200)
HDL: 49.4 mg/dL (ref >39.00)
LDL Cholesterol: 119 mg/dL — ABNORMAL HIGH (ref 0–99)
NONHDL: 139.6
Triglycerides: 104 mg/dL (ref 0.0–149.0)
VLDL: 20.8 mg/dL (ref 0.0–40.0)

## 2015-01-17 LAB — TSH: TSH: 2.26 u[IU]/mL (ref 0.35–4.50)

## 2015-01-20 ENCOUNTER — Ambulatory Visit
Admission: RE | Admit: 2015-01-20 | Discharge: 2015-01-20 | Disposition: A | Discharge status: Home / Self Care | Payer: BLUE CROSS/BLUE SHIELD | Source: Ambulatory Visit

## 2015-01-20 DIAGNOSIS — Z1231 Encounter for screening mammogram for malignant neoplasm of breast: Secondary | ICD-10-CM

## 2015-01-24 ENCOUNTER — Ambulatory Visit (INDEPENDENT_AMBULATORY_CARE_PROVIDER_SITE_OTHER): Payer: BLUE CROSS/BLUE SHIELD | Admitting: Family Medicine

## 2015-01-24 ENCOUNTER — Encounter: Payer: Self-pay | Admitting: Family Medicine

## 2015-01-24 VITALS — BP 128/68 | HR 63 | Temp 98.6°F | Ht 61.25 in | Wt 125.0 lb

## 2015-01-24 DIAGNOSIS — Z23 Encounter for immunization: Secondary | ICD-10-CM

## 2015-01-24 DIAGNOSIS — Z0000 Encounter for general adult medical examination without abnormal findings: Secondary | ICD-10-CM

## 2015-01-24 DIAGNOSIS — E7800 Pure hypercholesterolemia, unspecified: Secondary | ICD-10-CM

## 2015-01-24 DIAGNOSIS — N814 Uterovaginal prolapse, unspecified: Secondary | ICD-10-CM | POA: Diagnosis not present

## 2015-01-24 NOTE — Assessment & Plan Note (Signed)
Pt would like to get set up with a new gyn practice for management of this problem and routine exam  Thinks she may need to change size of pessary in light of overactive bladder symptoms   Will refer

## 2015-01-24 NOTE — Assessment & Plan Note (Signed)
LDL is improved with better diet  Disc goals for lipids and reasons to control them Rev labs with pt Rev low sat fat diet in detail   HDL remains over 40 Stable ratio

## 2015-01-24 NOTE — Assessment & Plan Note (Signed)
Reviewed health habits including diet and exercise and skin cancer prevention Reviewed appropriate screening tests for age  Also reviewed health mt list, fam hx and immunization status , as well as social and family history   See HPI  Labs reviewed  Declines Hep C and HIV screening due to low risk status   Breast exam nl  Ref to gyn for uterine prolapse Update Tdap today  Commended on great health habits

## 2015-01-24 NOTE — Patient Instructions (Signed)
Tdap (tetanus vaccine) today  Stop at check out for referral to gyn  Take care of yourself - keep up the good work with diet and exercise  Labs are stable

## 2015-01-24 NOTE — Progress Notes (Signed)
Pre visit review using our clinic review tool, if applicable. No additional management support is needed unless otherwise documented below in the visit note. 

## 2015-01-24 NOTE — Progress Notes (Signed)
Subjective:    Patient ID: Katherine Macdonald, female    DOB: September 11, 1955, 59 y.o.   MRN: [id number]  HPI Here for health maintenance exam and to review chronic medical problems    Feeling good overall  Nothing new medically   Wt is up 2 lb with bmi of 23 She does eat well  Joined a gym in April and really enjoys going - doing machines and free weights/ started with a personal trainer   BP Readings from Last 3 Encounters:  01/24/15 128/68  01/19/14 110/72  12/21/13 126/84    Hep C/ HIV screening  Not high risk - declines   Flu shot 10/15   Td 11/06- wants to get that done today   Mammogram 5/16, and it was read as negative  Self exam -no lumps   Pap was nl 6/14 per pt - was normal (was at [facility])  No hx of abn paps  Hx of a pessary - she puts this in herself - and no problems with it  She does have more frequent urination lately - slt stress incontinence  Wants to get set up with different gyn practice (? Change size of pessary)   Colonoscopy 8/14 - 10 year recall    Results for orders placed or performed in visit on 01/17/15  CBC with Differential/Platelet  Result Value Ref Range   WBC 3.1 (L) 4.0 - 10.5 K/uL   RBC 4.52 3.87 - 5.11 Mil/uL   Hemoglobin 13.7 12.0 - 15.0 g/dL   HCT 40.5 36.0 - 46.0 %   MCV 89.7 78.0 - 100.0 fl   MCHC 33.8 30.0 - 36.0 g/dL   RDW 13.7 11.5 - 15.5 %   Platelets 233.0 150.0 - 400.0 K/uL   Neutrophils Relative % 48.8 43.0 - 77.0 %   Lymphocytes Relative 32.7 12.0 - 46.0 %   Monocytes Relative 12.2 (H) 3.0 - 12.0 %   Eosinophils Relative 5.5 (H) 0.0 - 5.0 %   Basophils Relative 0.8 0.0 - 3.0 %   Neutro Abs 1.5 1.4 - 7.7 K/uL   Lymphs Abs 1.0 0.7 - 4.0 K/uL   Monocytes Absolute 0.4 0.1 - 1.0 K/uL   Eosinophils Absolute 0.2 0.0 - 0.7 K/uL   Basophils Absolute 0.0 0.0 - 0.1 K/uL  Comprehensive metabolic panel  Result Value Ref Range   Sodium 138 135 - 145 mEq/L   Potassium 4.9 3.5 - 5.1 mEq/L   Chloride 104 96 - 112 mEq/L     CO2 31 19 - 32 mEq/L   Glucose, Bld 94 70 - 99 mg/dL   BUN 14 6 - 23 mg/dL   Creatinine, Ser 0.93 0.40 - 1.20 mg/dL   Total Bilirubin 0.8 0.2 - 1.2 mg/dL   Alkaline Phosphatase 83 39 - 117 U/L   AST 25 0 - 37 U/L   ALT 34 0 - 35 U/L   Total Protein 7.0 6.0 - 8.3 g/dL   Albumin 4.3 3.5 - 5.2 g/dL   Calcium 9.7 8.4 - 10.5 mg/dL   GFR 65.81 >60.00 mL/min  Lipid panel  Result Value Ref Range   Cholesterol 189 0 - 200 mg/dL   Triglycerides 104.0 0.0 - 149.0 mg/dL   HDL 49.40 >39.00 mg/dL   VLDL 20.8 0.0 - 40.0 mg/dL   LDL Cholesterol 119 (H) 0 - 99 mg/dL   Total CHOL/HDL Ratio 4    NonHDL 139.60   TSH  Result Value Ref Range   TSH 2.26  0.35 - 4.50 uIU/mL     Hyperlipidemia Lab Results  Component Value Date   CHOL 189 01/17/2015   CHOL 207* 01/12/2014   CHOL 203* 01/06/2013   Lab Results  Component Value Date   HDL 49.40 01/17/2015   HDL 54.30 01/12/2014   HDL 50.90 01/06/2013   Lab Results  Component Value Date   LDLCALC 119* 01/17/2015   LDLCALC 136* 01/12/2014   LDLCALC 124* 01/02/2012   Lab Results  Component Value Date   TRIG 104.0 01/17/2015   TRIG 85.0 01/12/2014   TRIG 80.0 01/06/2013   Lab Results  Component Value Date   CHOLHDL 4 01/17/2015   CHOLHDL 4 01/12/2014   CHOLHDL 4 01/06/2013   Lab Results  Component Value Date   LDLDIRECT 133.0 01/06/2013   LDL is improved  HDL is over 40   healthy diet - really worked on that ! Overall stable   Patient Active Problem List   Diagnosis Date Noted  . History of cholecystectomy 12/21/2013  . ETD (eustachian tube dysfunction) 12/21/2013  . 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 Chronic buttock pain 01/10/2012  . Low back pain 01/10/2012  . Routine general medical examination at a health care facility 01/02/2012  . Colon cancer screening 11/27/2010  . HEADACHE 07/06/2010  . HYPERCHOLESTEROLEMIA 01/30/2010  . UTERINE PROLAPSE 11/15/2008  . MENOPAUSAL SYNDROME 11/15/2008  . SLEEP  DISORDER 12/25/2007  . CAROTID BRUIT, LEFT 10/06/2007  . HELICOBACTER PYLORI GASTRITIS 10/05/2007  . GERD 10/05/2007  . OVERACTIVE BLADDER 10/05/2007  . FIBROCYSTIC BREAST DISEASE 10/05/2007  . HYPERHIDROSIS 10/05/2007   Past Medical History  Diagnosis Date  . GERD (gastroesophageal reflux disease)   . Carotid bruit 09/2007    carotid doppler- normal  . Overactive bladder   . Sleep disorder   . Presence of pessary   . Female bladder prolapse    Past Surgical History  Procedure Laterality Date  . Bunionectomy      right foot  . Cholecystectomy     History  Substance Use Topics  . Smoking status: Never Smoker   . Smokeless tobacco: Never Used  . Alcohol Use: No   Family History  Problem Relation Age of Onset  . Arthritis Mother     Rheumatoid  . Heart disease Father     CABG  . Hyperlipidemia Father   . Cancer Maternal Aunt     breast   Allergies  Allergen Reactions  . Codeine     REACTION: GI upset  . Sulfonamide Derivatives     REACTION: hives  . Zolpidem Tartrate     REACTION: ? headache   Current Outpatient Prescriptions on File Prior to Visit  Medication Sig Dispense Refill  . Flaxseed MISC 2 teaspoon in smoothie    . Multiple Vitamin (MULTIVITAMIN) tablet Take 1 tablet by mouth daily.       No current facility-administered medications on file prior to visit.    Review of Systems Review of Systems  Constitutional: Negative for fever, appetite change, fatigue and unexpected weight change.  Eyes: Negative for pain and visual disturbance.  Respiratory: Negative for cough and shortness of breath.   Cardiovascular: Negative for cp or palpitations    Gastrointestinal: Negative for nausea, diarrhea and constipation.  Genitourinary: pos for slt inc in  for urgency and frequency. (occ stress incont) ,  Neg for dysuria  Skin: Negative for pallor or rash   Neurological: Negative for weakness, light-headedness, numbness and headaches.  Hematological: Negative  for adenopathy. Does not bruise/bleed easily.  Psychiatric/Behavioral: Negative for dysphoric mood. The patient is not nervous/anxious.         Objective:   Physical Exam  Constitutional: She appears well-developed and well-nourished. No distress.  HENT:  Head: Normocephalic and atraumatic.  Right Ear: External ear normal.  Left Ear: External ear normal.  Mouth/Throat: Oropharynx is clear and moist.  Eyes: Conjunctivae and EOM are normal. Pupils are equal, round, and reactive to light. No scleral icterus.  Neck: Normal range of motion. Neck supple. No JVD present. Carotid bruit is not present. No thyromegaly present.  Cardiovascular: Normal rate, regular rhythm, normal heart sounds and intact distal pulses.  Exam reveals no gallop.   Pulmonary/Chest: Effort normal and breath sounds normal. No respiratory distress. She has no wheezes. She exhibits no tenderness.  Abdominal: Soft. Bowel sounds are normal. She exhibits no distension, no abdominal bruit and no mass. There is no tenderness.  Genitourinary: No breast swelling, tenderness, discharge or bleeding.  Breast exam: No mass, nodules, thickening, tenderness, bulging, retraction, inflamation, nipple discharge or skin changes noted.  No axillary or clavicular LA.      Musculoskeletal: Normal range of motion. She exhibits no edema or tenderness.  Lymphadenopathy:    She has no cervical adenopathy.  Neurological: She is alert. She has normal reflexes. No cranial nerve deficit. She exhibits normal muscle tone. Coordination normal.  Skin: Skin is warm and dry. No rash noted. No erythema. No pallor.  Skin tag R axilla -evidence of inflammation due to friction (pt may return to remove this)  Psychiatric: She has a normal mood and affect.          Assessment & Plan:   Problem List Items Addressed This Visit    HYPERCHOLESTEROLEMIA    LDL is improved with better diet  Disc goals for lipids and reasons to control them Rev labs with  pt Rev low sat fat diet in detail   HDL remains over 40 Stable ratio      Routine general medical examination at a health care facility - Primary    Reviewed health habits including diet and exercise and skin cancer prevention Reviewed appropriate screening tests for age  Also reviewed health mt list, fam hx and immunization status , as well as social and family history   See HPI  Labs reviewed  Declines Hep C and HIV screening due to low risk status   Breast exam nl  Ref to gyn for uterine prolapse Update Tdap today  Commended on great health habits       Uterine prolapse    Pt would like to get set up with a new gyn practice for management of this problem and routine exam  Thinks she may need to change size of pessary in light of overactive bladder symptoms   Will refer        Relevant Orders   Ambulatory referral to Gynecology    Other Visit Diagnoses    Need for Tdap vaccination        Relevant Orders    Tdap vaccine greater than or equal to 7yo IM (Completed)

## 2015-03-06 ENCOUNTER — Ambulatory Visit: Payer: BLUE CROSS/BLUE SHIELD | Admitting: Family Medicine

## 2015-03-24 ENCOUNTER — Other Ambulatory Visit: Payer: Self-pay | Admitting: Obstetrics & Gynecology

## 2015-03-24 LAB — HM PAP SMEAR: HM Pap smear: NORMAL

## 2015-03-28 LAB — CYTOLOGY - PAP

## 2015-09-26 ENCOUNTER — Encounter: Payer: Self-pay | Admitting: Family Medicine

## 2016-03-01 LAB — HM PAP SMEAR: HM PAP: NORMAL

## 2016-04-09 ENCOUNTER — Other Ambulatory Visit: Payer: Self-pay | Admitting: Obstetrics & Gynecology

## 2016-04-15 ENCOUNTER — Other Ambulatory Visit: Payer: Self-pay | Admitting: Family Medicine

## 2016-04-15 ENCOUNTER — Other Ambulatory Visit: Payer: Self-pay | Admitting: Obstetrics & Gynecology

## 2016-04-15 DIAGNOSIS — Z1231 Encounter for screening mammogram for malignant neoplasm of breast: Secondary | ICD-10-CM

## 2016-04-30 ENCOUNTER — Ambulatory Visit
Admission: RE | Admit: 2016-04-30 | Discharge: 2016-04-30 | Disposition: A | Discharge status: Home / Self Care | Payer: No Typology Code available for payment source | Source: Ambulatory Visit | Attending: Obstetrics & Gynecology | Admitting: Obstetrics & Gynecology

## 2016-04-30 DIAGNOSIS — Z1231 Encounter for screening mammogram for malignant neoplasm of breast: Secondary | ICD-10-CM

## 2016-10-22 ENCOUNTER — Encounter: Payer: Self-pay | Admitting: Family Medicine

## 2016-10-22 ENCOUNTER — Ambulatory Visit (INDEPENDENT_AMBULATORY_CARE_PROVIDER_SITE_OTHER): Payer: Self-pay | Admitting: Family Medicine

## 2016-10-22 VITALS — BP 116/72 | HR 66 | Temp 98.9°F | Ht 61.25 in | Wt 124.2 lb

## 2016-10-22 DIAGNOSIS — L309 Dermatitis, unspecified: Secondary | ICD-10-CM | POA: Insufficient documentation

## 2016-10-22 MED ORDER — MOMETASONE FUROATE 0.1 % EX CREA: 1.0000 "application " | TOPICAL_CREAM | Freq: Every day | CUTANEOUS | 0 refills | Status: DC

## 2016-10-22 NOTE — Progress Notes (Signed)
Subjective:    Patient ID: Katherine Macdonald, female    DOB: [birth year], 61 y.o.   MRN: [id number]  HPI Here for a rash on her neck  Over a month  Itchy  occ pain- burns a little   Has used some essential oils  Psoriasis cream otc (helped very briefly)  Triamcinolone 0.1%   Her neck feels tense as well -esp on the L   Patient Active Problem List   Diagnosis Date Noted  . Dermatitis 10/22/2016  . History of cholecystectomy 12/21/2013  . ETD (eustachian tube dysfunction) 12/21/2013  . 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 Chronic buttock pain 01/10/2012  . Low back pain 01/10/2012  . Routine general medical examination at a health care facility 01/02/2012  . Colon cancer screening 11/27/2010  . HEADACHE 07/06/2010  . HYPERCHOLESTEROLEMIA 01/30/2010  . Uterine prolapse 11/15/2008  . MENOPAUSAL SYNDROME 11/15/2008  . SLEEP DISORDER 12/25/2007  . CAROTID BRUIT, LEFT 10/06/2007  . HELICOBACTER PYLORI GASTRITIS 10/05/2007  . GERD 10/05/2007  . OVERACTIVE BLADDER 10/05/2007  . FIBROCYSTIC BREAST DISEASE 10/05/2007  . HYPERHIDROSIS 10/05/2007   Past Medical History:  Diagnosis Date  . Carotid bruit 09/2007   carotid doppler- normal  . Female bladder prolapse   . GERD (gastroesophageal reflux disease)   . Overactive bladder   . Presence of pessary   . Sleep disorder    Past Surgical History:  Procedure Laterality Date  . BUNIONECTOMY     right foot  . CHOLECYSTECTOMY     Social History  Substance Use Topics  . Smoking status: Never Smoker  . Smokeless tobacco: Never Used  . Alcohol use No   Family History  Problem Relation Age of Onset  . Arthritis Mother     Rheumatoid  . Heart disease Father     CABG  . Hyperlipidemia Father   . Cancer Maternal Aunt     breast   Allergies  Allergen Reactions  . Codeine     REACTION: GI upset  . Sulfonamide Derivatives     REACTION: hives  . Zolpidem Tartrate     REACTION: ? headache   Current  Outpatient Prescriptions on File Prior to Visit  Medication Sig Dispense Refill  . Flaxseed MISC 2 teaspoon in smoothie    . Multiple Vitamin (MULTIVITAMIN) tablet Take 1 tablet by mouth daily.      Marland Kitchen OVER THE COUNTER MEDICATION Doterra vitamins: takes daily     No current facility-administered medications on file prior to visit.     Review of Systems Review of Systems  Constitutional: Negative for fever, appetite change, fatigue and unexpected weight change.  Eyes: Negative for pain and visual disturbance.  Respiratory: Negative for cough and shortness of breath.   Cardiovascular: Negative for cp or palpitations    Gastrointestinal: Negative for nausea, diarrhea and constipation.  Genitourinary: Negative for urgency and frequency.  Skin: Negative for pallor and pos for  rash   MSK neg for joint pain  Neurological: Negative for weakness, light-headedness, numbness and headaches.  Hematological: Negative for adenopathy. Does not bruise/bleed easily.  Psychiatric/Behavioral: Negative for dysphoric mood. The patient is not nervous/anxious.         Objective:   Physical Exam  Constitutional: She appears well-developed and well-nourished. No distress.  Well appearing  HENT:  Head: Normocephalic and atraumatic.  Mouth/Throat: Oropharynx is clear and moist.  Eyes: Conjunctivae and EOM are normal. Pupils are equal, round, and reactive to light. Right eye exhibits no  discharge. Left eye exhibits no discharge.  Neck: Normal range of motion. Neck supple.  Pulmonary/Chest: Breath sounds normal. No respiratory distress.  Musculoskeletal: She exhibits no edema.  Lymphadenopathy:    She has no cervical adenopathy.  Neurological: She is alert. No cranial nerve deficit.  Skin: Skin is warm and dry. Rash noted. There is erythema.  Pink scaley confluent rash with early plaque formation on back of neck Not in scalp No skin interruption     Psychiatric: She has a normal mood and affect.           Assessment & Plan:   Problem List Items Addressed This Visit      Musculoskeletal and Integument   Dermatitis    Back of neck  Contact derm/eczema vs early psoriasis  inst to clean with gentle soap and water Avoid heat and artificial fragrances  Moisturize (eucerin) bid Mometasone qd to aff areas If no imp consider derm visit

## 2016-10-22 NOTE — Progress Notes (Signed)
Pre visit review using our clinic review tool, if applicable. No additional management support is needed unless otherwise documented below in the visit note. 

## 2016-10-22 NOTE — Patient Instructions (Signed)
It looks like you have dermatitis -cannot rule out psoriasis  Use gentle soap and water Avoid harsh detergents and fragrances and hot water  Use the mometasone cream once daily in the evening (can stop when it calms down)  Eucerin cream - twice daily (can use it over the steroid cream once it is absorbed) -- otc / store brand ok - no color or fragrance  Update if not improving in 2 weeks

## 2016-10-24 NOTE — Assessment & Plan Note (Signed)
Back of neck  Contact derm/eczema vs early psoriasis  inst to clean with gentle soap and water Avoid heat and artificial fragrances  Moisturize (eucerin) bid Mometasone qd to aff areas If no imp consider derm visit

## 2017-04-20 ENCOUNTER — Telehealth: Payer: Self-pay | Admitting: Family Medicine

## 2017-04-20 DIAGNOSIS — E7800 Pure hypercholesterolemia, unspecified: Secondary | ICD-10-CM

## 2017-04-20 DIAGNOSIS — Z0000 Encounter for general adult medical examination without abnormal findings: Secondary | ICD-10-CM

## 2017-04-20 NOTE — Telephone Encounter (Signed)
-----   Message from Ellamae Sia sent at 04/14/2017  9:13 AM EDT ----- Regarding: Lab orders for Wednesday,8.28.18 Patient is scheduled for CPX labs, please order future labs, Thanks , Karna Christmas

## 2017-04-23 ENCOUNTER — Other Ambulatory Visit (INDEPENDENT_AMBULATORY_CARE_PROVIDER_SITE_OTHER): Payer: Self-pay

## 2017-04-23 DIAGNOSIS — Z0000 Encounter for general adult medical examination without abnormal findings: Secondary | ICD-10-CM

## 2017-04-23 DIAGNOSIS — E7800 Pure hypercholesterolemia, unspecified: Secondary | ICD-10-CM

## 2017-04-23 LAB — COMPREHENSIVE METABOLIC PANEL
ALBUMIN: 4.5 g/dL (ref 3.5–5.2)
ALT: 16 U/L (ref 0–35)
AST: 16 U/L (ref 0–37)
Alkaline Phosphatase: 59 U/L (ref 39–117)
BUN: 12 mg/dL (ref 6–23)
CHLORIDE: 103 meq/L (ref 96–112)
CO2: 33 mEq/L — ABNORMAL HIGH (ref 19–32)
CREATININE: 0.96 mg/dL (ref 0.40–1.20)
Calcium: 10.1 mg/dL (ref 8.4–10.5)
GFR: 62.95 mL/min (ref >60.00)
GLUCOSE: 96 mg/dL (ref 70–99)
Potassium: 5.2 mEq/L — ABNORMAL HIGH (ref 3.5–5.1)
SODIUM: 140 meq/L (ref 135–145)
Total Bilirubin: 0.6 mg/dL (ref 0.2–1.2)
Total Protein: 7.4 g/dL (ref 6.0–8.3)

## 2017-04-23 LAB — CBC WITH DIFFERENTIAL/PLATELET
Basophils Absolute: 0.1 10*3/uL (ref 0.0–0.1)
Basophils Relative: 1 % (ref 0.0–3.0)
EOS PCT: 4.5 % (ref 0.0–5.0)
Eosinophils Absolute: 0.2 10*3/uL (ref 0.0–0.7)
HCT: 42.6 % (ref 36.0–46.0)
HEMOGLOBIN: 14.4 g/dL (ref 12.0–15.0)
LYMPHS PCT: 18.9 % (ref 12.0–46.0)
Lymphs Abs: 1 10*3/uL (ref 0.7–4.0)
MCHC: 33.9 g/dL (ref 30.0–36.0)
MCV: 92.3 fl (ref 78.0–100.0)
MONOS PCT: 8 % (ref 3.0–12.0)
Monocytes Absolute: 0.4 10*3/uL (ref 0.1–1.0)
Neutro Abs: 3.4 10*3/uL (ref 1.4–7.7)
Neutrophils Relative %: 67.6 % (ref 43.0–77.0)
Platelets: 257 10*3/uL (ref 150.0–400.0)
RBC: 4.61 Mil/uL (ref 3.87–5.11)
RDW: 12.7 % (ref 11.5–15.5)
WBC: 5 10*3/uL (ref 4.0–10.5)

## 2017-04-23 LAB — LIPID PANEL
CHOL/HDL RATIO: 4
CHOLESTEROL: 213 mg/dL — AB (ref 0–200)
HDL: 54.6 mg/dL (ref >39.00)
LDL CALC: 141 mg/dL — AB (ref 0–99)
NONHDL: 158.77
Triglycerides: 89 mg/dL (ref 0.0–149.0)
VLDL: 17.8 mg/dL (ref 0.0–40.0)

## 2017-04-23 LAB — TSH: TSH: 2.92 u[IU]/mL (ref 0.35–4.50)

## 2017-04-29 ENCOUNTER — Ambulatory Visit (INDEPENDENT_AMBULATORY_CARE_PROVIDER_SITE_OTHER): Payer: Self-pay | Admitting: Family Medicine

## 2017-04-29 ENCOUNTER — Encounter: Payer: Self-pay | Admitting: Family Medicine

## 2017-04-29 VITALS — BP 126/68 | HR 69 | Temp 98.3°F | Ht 61.25 in | Wt 124.5 lb

## 2017-04-29 DIAGNOSIS — Z0000 Encounter for general adult medical examination without abnormal findings: Secondary | ICD-10-CM

## 2017-04-29 DIAGNOSIS — L309 Dermatitis, unspecified: Secondary | ICD-10-CM

## 2017-04-29 DIAGNOSIS — E7800 Pure hypercholesterolemia, unspecified: Secondary | ICD-10-CM

## 2017-04-29 NOTE — Assessment & Plan Note (Signed)
Reviewed health habits including diet and exercise and skin cancer prevention Reviewed appropriate screening tests for age  Also reviewed health mt list, fam hx and immunization status , as well as social and family history   See HPI Labs reviewed (also her gyn labs) Disc plan for cholesterol control  Good health habits Declines HIV/ Hep C screen due to low risk Declines flu shot  She will make her own mammogram appt

## 2017-04-29 NOTE — Patient Instructions (Addendum)
Don't forget to schedule your annual mammogram   Cholesterol :   Avoid red meat/ fried foods/ egg yolks/ fatty breakfast meats/ butter, cheese and high fat dairy/ and shellfish    Get back to exercise when you can   Take care of yourself

## 2017-04-29 NOTE — Assessment & Plan Note (Signed)
Still occ gets on neck and forehead  Uses mometasone prn - usually clears quickly

## 2017-04-29 NOTE — Progress Notes (Signed)
Subjective:    Patient ID: Katherine Macdonald, female    DOB: 1955/11/25, 61 y.o.   MRN: [id number]  HPI Here for health maintenance exam and to review chronic medical problems    Feeling good overall   Wt Readings from Last 3 Encounters:  04/29/17 124 lb 8 oz (56.5 kg)  10/22/16 124 lb 4 oz (56.4 kg)  01/24/15 125 lb (56.7 kg)  mt weight well /doing a good job  Despite not exercising- very busy   (caring for husb parents - difficult to fit it in)  23.33 kg/m  Hep C/HIV screen - declines/low risk   Declines flu shot    Mammogram 9/17-neg- it is time to schedule (she will schedule it) Self breast exam -no lumps   Pap 7/17- normal  Has a pessary - will go back to gyn prn  No problems   Colonoscopy 8/14  Recall 10 y with Dr Carlean Purl   Tdap 5/16  Hyperlipidemia Lab Results  Component Value Date   CHOL 213 (H) 04/23/2017   CHOL 189 01/17/2015   CHOL 207 (H) 01/12/2014   Lab Results  Component Value Date   HDL 54.60 04/23/2017   HDL 49.40 01/17/2015   HDL 54.30 01/12/2014   Lab Results  Component Value Date   LDLCALC 141 (H) 04/23/2017   LDLCALC 119 (H) 01/17/2015   LDLCALC 136 (H) 01/12/2014   Lab Results  Component Value Date   TRIG 89.0 04/23/2017   TRIG 104.0 01/17/2015   TRIG 85.0 01/12/2014   Lab Results  Component Value Date   CHOLHDL 4 04/23/2017   CHOLHDL 4 01/17/2015   CHOLHDL 4 01/12/2014   Lab Results  Component Value Date   LDLDIRECT 133.0 01/06/2013   Diet controlled   Labs from gyn 147 LDL and 67 HDL  Has eaten more cheese lately  Rarely eats fried food/ red meat/breakfast meats    Had vit D level at gyn 42.7  Lab on 04/23/2017  Component Date Value Ref Range Status  . WBC 04/23/2017 5.0  4.0 - 10.5 K/uL Final  . RBC 04/23/2017 4.61  3.87 - 5.11 Mil/uL Final  . Hemoglobin 04/23/2017 14.4  12.0 - 15.0 g/dL Final  . HCT 04/23/2017 42.6  36.0 - 46.0 % Final  . MCV 04/23/2017 92.3  78.0 - 100.0 fl Final  . MCHC 04/23/2017 33.9   30.0 - 36.0 g/dL Final  . RDW 04/23/2017 12.7  11.5 - 15.5 % Final  . Platelets 04/23/2017 257.0  150.0 - 400.0 K/uL Final  . Neutrophils Relative % 04/23/2017 67.6  43.0 - 77.0 % Final  . Lymphocytes Relative 04/23/2017 18.9  12.0 - 46.0 % Final  . Monocytes Relative 04/23/2017 8.0  3.0 - 12.0 % Final  . Eosinophils Relative 04/23/2017 4.5  0.0 - 5.0 % Final  . Basophils Relative 04/23/2017 1.0  0.0 - 3.0 % Final  . Neutro Abs 04/23/2017 3.4  1.4 - 7.7 K/uL Final  . Lymphs Abs 04/23/2017 1.0  0.7 - 4.0 K/uL Final  . Monocytes Absolute 04/23/2017 0.4  0.1 - 1.0 K/uL Final  . Eosinophils Absolute 04/23/2017 0.2  0.0 - 0.7 K/uL Final  . Basophils Absolute 04/23/2017 0.1  0.0 - 0.1 K/uL Final  . Sodium 04/23/2017 140  135 - 145 mEq/L Final  . Potassium 04/23/2017 5.2* 3.5 - 5.1 mEq/L Final  . Chloride 04/23/2017 103  96 - 112 mEq/L Final  . CO2 04/23/2017 33* 19 - 32 mEq/L Final  .  Glucose, Bld 04/23/2017 96  70 - 99 mg/dL Final  . BUN 04/23/2017 12  6 - 23 mg/dL Final  . Creatinine, Ser 04/23/2017 0.96  0.40 - 1.20 mg/dL Final  . Total Bilirubin 04/23/2017 0.6  0.2 - 1.2 mg/dL Final  . Alkaline Phosphatase 04/23/2017 59  39 - 117 U/L Final  . AST 04/23/2017 16  0 - 37 U/L Final  . ALT 04/23/2017 16  0 - 35 U/L Final  . Total Protein 04/23/2017 7.4  6.0 - 8.3 g/dL Final  . Albumin 04/23/2017 4.5  3.5 - 5.2 g/dL Final  . Calcium 04/23/2017 10.1  8.4 - 10.5 mg/dL Final  . GFR 04/23/2017 62.95  >60.00 mL/min Final  . Cholesterol 04/23/2017 213* 0 - 200 mg/dL Final   ATP III Classification       Desirable:  < 200 mg/dL               Borderline High:  200 - 239 mg/dL          High:  > = 240 mg/dL  . Triglycerides 04/23/2017 89.0  0.0 - 149.0 mg/dL Final   Normal:  <150 mg/dLBorderline High:  150 - 199 mg/dL  . HDL 04/23/2017 54.60  >39.00 mg/dL Final  . VLDL 04/23/2017 17.8  0.0 - 40.0 mg/dL Final  . LDL Cholesterol 04/23/2017 141* 0 - 99 mg/dL Final  . Total CHOL/HDL Ratio 04/23/2017 4    Final                  Men          Women1/2 Average Risk     3.4          3.3Average Risk          5.0          4.42X Average Risk          9.6          7.13X Average Risk          15.0          11.0                      . NonHDL 04/23/2017 158.77   Final   NOTE:  Non-HDL goal should be 30 mg/dL higher than patient's LDL goal (i.e. LDL goal of < 70 mg/dL, would have non-HDL goal of < 100 mg/dL)  . TSH 04/23/2017 2.92  0.35 - 4.50 uIU/mL Final    K was slt inc  Takes a multi vitamin  K was fine at gyn 4.3   Patient Active Problem List   Diagnosis Date Noted  . Dermatitis 10/22/2016  . History of cholecystectomy 12/21/2013  . ETD (eustachian tube dysfunction) 12/21/2013  . Chronic buttock pain 01/10/2012  . Low back pain 01/10/2012  . Routine general medical examination at a health care facility 01/02/2012  . Colon cancer screening 11/27/2010  . HEADACHE 07/06/2010  . HYPERCHOLESTEROLEMIA 01/30/2010  . Uterine prolapse 11/15/2008  . MENOPAUSAL SYNDROME 11/15/2008  . SLEEP DISORDER 12/25/2007  . HELICOBACTER PYLORI GASTRITIS 10/05/2007  . GERD 10/05/2007  . OVERACTIVE BLADDER 10/05/2007  . FIBROCYSTIC BREAST DISEASE 10/05/2007  . HYPERHIDROSIS 10/05/2007   Past Medical History:  Diagnosis Date  . Carotid bruit 09/2007   carotid doppler- normal  . Female bladder prolapse   . GERD (gastroesophageal reflux disease)   . Overactive bladder   . Presence of pessary   . Sleep disorder  Past Surgical History:  Procedure Laterality Date  . BUNIONECTOMY     right foot  . CHOLECYSTECTOMY     Social History  Substance Use Topics  . Smoking status: Never Smoker  . Smokeless tobacco: Never Used  . Alcohol use No   Family History  Problem Relation Age of Onset  . Arthritis Mother        Rheumatoid  . Heart disease Father        CABG  . Hyperlipidemia Father   . Cancer Maternal Aunt        breast   Allergies  Allergen Reactions  . Codeine     REACTION: GI upset  .  Sulfonamide Derivatives     REACTION: hives  . Zolpidem Tartrate     REACTION: ? headache   Current Outpatient Prescriptions on File Prior to Visit  Medication Sig Dispense Refill  . mometasone (ELOCON) 0.1 % cream Apply 1 application topically daily. Apply to affected area once daily as directed 30 g 0  . Multiple Vitamin (MULTIVITAMIN) tablet Take 1 tablet by mouth daily.      Marland Kitchen OVER THE COUNTER MEDICATION Doterra vitamins: takes daily    . Probiotic Product (PROBIOTIC DAILY PO) Take 1 capsule by mouth daily.     No current facility-administered medications on file prior to visit.     Review of Systems    Review of Systems  Constitutional: Negative for fever, appetite change, fatigue and unexpected weight change.  Eyes: Negative for pain and visual disturbance.  Respiratory: Negative for cough and shortness of breath.   Cardiovascular: Negative for cp or palpitations    Gastrointestinal: Negative for nausea, diarrhea and constipation.  Genitourinary: Negative for urgency and frequency.  Skin: Negative for pallor or rash   Neurological: Negative for weakness, light-headedness, numbness and headaches.  Hematological: Negative for adenopathy. Does not bruise/bleed easily.  Psychiatric/Behavioral: Negative for dysphoric mood. The patient is not nervous/anxious.      Objective:   Physical Exam        Assessment & Plan:   Problem List Items Addressed This Visit      Musculoskeletal and Integument   Dermatitis    Still occ gets on neck and forehead  Uses mometasone prn - usually clears quickly         Other   HYPERCHOLESTEROLEMIA    Disc goals for lipids and reasons to control them Rev labs with pt Rev low sat fat diet in detail Both LDL and HDL are up (stable ratio)  Will cut back on cheese and red meat Handout given  Consider statin in the future if LDL continues to rise      Routine general medical examination at a health care facility - Primary    Reviewed  health habits including diet and exercise and skin cancer prevention Reviewed appropriate screening tests for age  Also reviewed health mt list, fam hx and immunization status , as well as social and family history   See HPI Labs reviewed (also her gyn labs) Disc plan for cholesterol control  Good health habits Declines HIV/ Hep C screen due to low risk Declines flu shot  She will make her own mammogram appt

## 2017-04-29 NOTE — Assessment & Plan Note (Signed)
Disc goals for lipids and reasons to control them Rev labs with pt Rev low sat fat diet in detail Both LDL and HDL are up (stable ratio)  Will cut back on cheese and red meat Handout given  Consider statin in the future if LDL continues to rise

## 2017-05-26 ENCOUNTER — Other Ambulatory Visit: Payer: Self-pay | Admitting: Family Medicine

## 2017-05-26 DIAGNOSIS — Z1231 Encounter for screening mammogram for malignant neoplasm of breast: Secondary | ICD-10-CM

## 2017-06-06 ENCOUNTER — Ambulatory Visit
Admission: RE | Admit: 2017-06-06 | Discharge: 2017-06-06 | Disposition: A | Discharge status: Home / Self Care | Payer: No Typology Code available for payment source | Source: Ambulatory Visit | Attending: Family Medicine | Admitting: Family Medicine

## 2017-06-06 DIAGNOSIS — Z1231 Encounter for screening mammogram for malignant neoplasm of breast: Secondary | ICD-10-CM

## 2017-10-17 ENCOUNTER — Other Ambulatory Visit: Payer: Self-pay

## 2017-10-17 ENCOUNTER — Encounter: Payer: Self-pay | Admitting: Emergency Medicine

## 2017-10-17 ENCOUNTER — Emergency Department
Admission: EM | Admit: 2017-10-17 | Discharge: 2017-10-17 | Disposition: A | Discharge status: Home / Self Care | Payer: No Typology Code available for payment source | Attending: Emergency Medicine | Admitting: Emergency Medicine

## 2017-10-17 DIAGNOSIS — R112 Nausea with vomiting, unspecified: Secondary | ICD-10-CM | POA: Insufficient documentation

## 2017-10-17 DIAGNOSIS — Z79899 Other long term (current) drug therapy: Secondary | ICD-10-CM | POA: Insufficient documentation

## 2017-10-17 DIAGNOSIS — R197 Diarrhea, unspecified: Secondary | ICD-10-CM | POA: Insufficient documentation

## 2017-10-17 DIAGNOSIS — R55 Syncope and collapse: Secondary | ICD-10-CM

## 2017-10-17 LAB — COMPREHENSIVE METABOLIC PANEL
ALBUMIN: 4.1 g/dL (ref 3.5–5.0)
ALK PHOS: 57 U/L (ref 38–126)
ALT: 27 U/L (ref 14–54)
AST: 36 U/L (ref 15–41)
Anion gap: 12 (ref 5–15)
BUN: 22 mg/dL — ABNORMAL HIGH (ref 6–20)
CALCIUM: 9.1 mg/dL (ref 8.9–10.3)
CO2: 20 mmol/L — ABNORMAL LOW (ref 22–32)
CREATININE: 0.98 mg/dL (ref 0.44–1.00)
Chloride: 106 mmol/L (ref 101–111)
GFR calc Af Amer: >60 mL/min (ref >60)
GLUCOSE: 199 mg/dL — AB (ref 65–99)
POTASSIUM: 4.3 mmol/L (ref 3.5–5.1)
Sodium: 138 mmol/L (ref 135–145)
TOTAL PROTEIN: 7.3 g/dL (ref 6.5–8.1)
Total Bilirubin: 1.2 mg/dL (ref 0.3–1.2)

## 2017-10-17 LAB — CBC
HEMATOCRIT: 40.7 % (ref 35.0–47.0)
Hemoglobin: 13.9 g/dL (ref 12.0–16.0)
MCH: 31.1 pg (ref 26.0–34.0)
MCHC: 34.3 g/dL (ref 32.0–36.0)
MCV: 90.6 fL (ref 80.0–100.0)
PLATELETS: 232 10*3/uL (ref 150–440)
RBC: 4.49 MIL/uL (ref 3.80–5.20)
RDW: 13.1 % (ref 11.5–14.5)
WBC: 9.6 10*3/uL (ref 3.6–11.0)

## 2017-10-17 LAB — LIPASE, BLOOD: Lipase: 37 U/L (ref 11–51)

## 2017-10-17 LAB — URINALYSIS, COMPLETE (UACMP) WITH MICROSCOPIC
Bacteria, UA: NONE SEEN
Bilirubin Urine: NEGATIVE
GLUCOSE, UA: NEGATIVE mg/dL
Hgb urine dipstick: NEGATIVE
Ketones, ur: NEGATIVE mg/dL
LEUKOCYTES UA: NEGATIVE
NITRITE: NEGATIVE
PH: 7 (ref 5.0–8.0)
Protein, ur: NEGATIVE mg/dL
SPECIFIC GRAVITY, URINE: 1.014 (ref 1.005–1.030)
Squamous Epithelial / LPF: NONE SEEN

## 2017-10-17 LAB — TROPONIN I: Troponin I: <0.03 ng/mL (ref <0.03)

## 2017-10-17 LAB — BLOOD GAS, VENOUS
ACID-BASE EXCESS: 0.1 mmol/L (ref 0.0–2.0)
BICARBONATE: 24.7 mmol/L (ref 20.0–28.0)
O2 SAT: 97.1 %
PCO2 VEN: 39 mmHg — AB (ref 44.0–60.0)
PH VEN: 7.41 (ref 7.250–7.430)
Patient temperature: 37
pO2, Ven: 91 mmHg — ABNORMAL HIGH (ref 32.0–45.0)

## 2017-10-17 MED ORDER — ONDANSETRON HCL 4 MG/2ML IJ SOLN
4.0000 mg | Freq: Once | INTRAMUSCULAR | Status: AC
  Administered 2017-10-17: 4 mg via INTRAVENOUS
  Filled 2017-10-17: qty 2

## 2017-10-17 MED ORDER — SODIUM CHLORIDE 0.9 % IV BOLUS (SEPSIS)
1000.0000 mL | Freq: Once | INTRAVENOUS | Status: AC
  Administered 2017-10-17: 1000 mL via INTRAVENOUS

## 2017-10-17 MED ORDER — ONDANSETRON HCL 4 MG PO TABS: 4.0000 mg | ORAL_TABLET | Freq: Three times a day (TID) | ORAL | 0 refills | Status: AC | PRN

## 2017-10-17 NOTE — ED Notes (Signed)
Resumed care from [place], [place]. Pt resting with family at bedside. Pt appears pale and sleepy. Pt able to appropriately answer questions, family fills in blanks.

## 2017-10-17 NOTE — ED Provider Notes (Signed)
[facility] Emergency Department Provider Note ____________________________________________   First MD Initiated Contact with Patient 10/17/17 0725     (approximate)  I have reviewed the triage vital signs and the nursing notes.   HISTORY  Chief Complaint Emesis; Nausea; Diarrhea; and Loss of Consciousness    HPI Katherine Macdonald is a 62 y.o. female with past medical history as noted below who presents with nausea, vomiting, diarrhea, acute onset approximately 8 hours ago, with approximately 6 episodes of vomiting which was nonbloody, and one episode of diarrhea since that time.  Per the patient's husband, this morning after she went to the bathroom and walked to the sofa to lay down, when she laid down she appeared to briefly lose consciousness.  There was no shaking or seizure-like activity.  At this time, the patient primarily states that she has nausea and feels weak and dizzy.  She denies other acute symptoms.  There is no associated abdominal pain.  No fever or urinary symptoms.  She denies recent antibiotic use or travel, and states that she had a work lunch yesterday but no other foods that she does not normally eat.   Past Medical History:  Diagnosis Date  . Carotid bruit 09/2007   carotid doppler- normal  . Female bladder prolapse   . GERD (gastroesophageal reflux disease)   . Overactive bladder   . Presence of pessary   . Sleep disorder     Patient Active Problem List   Diagnosis Date Noted  . Dermatitis 10/22/2016  . History of cholecystectomy 12/21/2013  . Chronic buttock pain 01/10/2012  . Low back pain 01/10/2012  . Routine general medical examination at a health care facility 01/02/2012  . Colon cancer screening 11/27/2010  . HEADACHE 07/06/2010  . HYPERCHOLESTEROLEMIA 01/30/2010  . Uterine prolapse 11/15/2008  . MENOPAUSAL SYNDROME 11/15/2008  . SLEEP DISORDER 12/25/2007  . HELICOBACTER PYLORI GASTRITIS 10/05/2007  . GERD  10/05/2007  . OVERACTIVE BLADDER 10/05/2007  . FIBROCYSTIC BREAST DISEASE 10/05/2007  . HYPERHIDROSIS 10/05/2007    Past Surgical History:  Procedure Laterality Date  . BUNIONECTOMY     right foot  . CHOLECYSTECTOMY      Prior to Admission medications   Medication Sig Start Date End Date Taking? Authorizing Provider  Multiple Vitamin (MULTIVITAMIN) tablet Take 1 tablet by mouth daily.     Yes [provider]  OVER THE COUNTER MEDICATION Doterra vitamins: takes daily   Yes [provider]  mometasone (ELOCON) 0.1 % cream Apply 1 application topically daily. Apply to affected area once daily as directed 10/22/16   Tower, Wynelle Fanny, MD    Allergies Codeine; Sulfonamide derivatives; and Zolpidem tartrate  Family History  Problem Relation Age of Onset  . Arthritis Mother        Rheumatoid  . Heart disease Father        CABG  . Hyperlipidemia Father   . Cancer Maternal Aunt        breast    Social History Social History   Tobacco Use  . Smoking status: Never Smoker  . Smokeless tobacco: Never Used  Substance Use Topics  . Alcohol use: No    Alcohol/week: 0.0 oz  . Drug use: No    Review of Systems  Constitutional: No fever. Eyes: No redness. ENT: No sore throat. Cardiovascular: Denies chest pain. Respiratory: Denies shortness of breath. Gastrointestinal: Positive for nausea and vomiting.  Genitourinary: Negative for dysuria.  Musculoskeletal: Negative for back pain. Skin: Negative  for rash. Neurological: Negative for headache.   ____________________________________________   PHYSICAL EXAM:  VITAL SIGNS: ED Triage Vitals  Enc Vitals Group     BP 10/17/17 0644 (!) 92/50     Pulse Rate 10/17/17 0645 61     Resp 10/17/17 0645 15     Temp 10/17/17 0645 98.3 F (36.8 C)     Temp Source 10/17/17 0645 Oral     SpO2 10/17/17 0645 96 %     Weight 10/17/17 0646 120 lb (54.4 kg)     Height 10/17/17 0646 5\' 2"  (1.575 m)     Head Circumference  --      Peak Flow --      Pain Score --      Pain Loc --      Pain Edu? --      Excl. in [place]? --     Constitutional: Alert, slightly uncomfortable but not acutely ill-appearing. Eyes: Conjunctivae are normal.  EOMI.  PERRLA. Head: Atraumatic. Nose: No congestion/rhinnorhea. Mouth/Throat: Mucous membranes are dry.   Neck: Normal range of motion.  Cardiovascular: Normal rate, regular rhythm. Grossly normal heart sounds.  Good peripheral circulation. Respiratory: Normal respiratory effort.  No retractions. Lungs CTAB. Gastrointestinal: Soft and nontender. No distention.  Genitourinary: No flank tenderness. Musculoskeletal: No lower extremity edema.  Extremities warm and well perfused.  Neurologic:  Normal speech and language. No gross focal neurologic deficits are appreciated.  Skin:  Skin is warm and dry. No rash noted.  Somewhat pale. Psychiatric: Mood and affect are normal. Speech and behavior are normal.  ____________________________________________   LABS (all labs ordered are listed, but only abnormal results are displayed)  Labs Reviewed  COMPREHENSIVE METABOLIC PANEL - Abnormal; Notable for the following components:      Result Value   CO2 20 (*)    Glucose, Bld 199 (*)    BUN 22 (*)    All other components within normal limits  URINALYSIS, COMPLETE (UACMP) WITH MICROSCOPIC - Abnormal; Notable for the following components:   Color, Urine YELLOW (*)    APPearance CLEAR (*)    All other components within normal limits  BLOOD GAS, VENOUS - Abnormal; Notable for the following components:   pCO2, Ven 39 (*)    pO2, Ven 91.0 (*)    All other components within normal limits  LIPASE, BLOOD  CBC  TROPONIN I  POC URINE PREG, ED   ____________________________________________  EKG  ED ECG REPORT I, Arta Silence, the attending physician, personally viewed and interpreted this ECG.  Date: 10/17/2017 EKG Time: 737 Rate: 70 Rhythm: normal sinus rhythm QRS Axis:  Borderline right axis Intervals: normal ST/T Wave abnormalities: normal Narrative Interpretation: no evidence of acute ischemia  ____________________________________________  RADIOLOGY    ____________________________________________   PROCEDURES  Procedure(s) performed: No  Procedures  Critical Care performed: No ____________________________________________   INITIAL IMPRESSION / ASSESSMENT AND PLAN / ED COURSE  Pertinent labs & imaging results that were available during my care of the patient were reviewed by me and considered in my medical decision making (see chart for details).  62 year old female with past medical history as noted above presents with nausea, vomiting, and one episode of diarrhea over the last several hours with associated syncope this morning after the patient had gotten up and gone to the bathroom.  Past medical records reviewed in epic and are noncontributory.  On exam, the patient is uncomfortable but not acutely ill-appearing.  She appears slightly pale.  Blood pressure is  borderline low, but other vital signs are normal.  Abdomen is soft and nontender.  Mucous membranes are somewhat dry.  Overall presentation is consistent with likely gastroenteritis versus foodborne illness.  Syncope is likely vasovagal.  Plan: Basic and hepatobiliary labs, EKG and troponin given the syncope, IV fluids and symptomatic medications, and reassess.  Clinical Course as of Oct 17 1098  Fri Oct 17, 2017  1058 Appearance: Grayland Ormond [SS]    Clinical Course User Index [SS] Arta Silence, MD   ----------------------------------------- 10:59 AM on 10/17/2017 -----------------------------------------  Patient's initial lab workup was unremarkable except for low bicarb on the metabolic panel.  This is likely due to vomiting and dehydration, but I obtained a VBG to verify the patient's pH which is normal.  Her other labs and UA are within normal limits.  Patient's  blood pressure is now in the 488-891 range systolic, and she appears much more comfortable.  She has had no further vomiting in the ED.  At this time the patient feels well to go home.  Overall this presentation is consistent with viral gastroenteritis.  I explained the results of the workup to the patient and her family members.  Return precautions given, and the patient expresses understanding.  ____________________________________________   FINAL CLINICAL IMPRESSION(S) / ED DIAGNOSES  Final diagnoses:  Nausea vomiting and diarrhea  Syncope, unspecified syncope type      NEW MEDICATIONS STARTED DURING THIS VISIT:  New Prescriptions   No medications on file     Note:  This document was prepared using Dragon voice recognition software and may include unintentional dictation errors.    Arta Silence, MD 10/17/17 1100

## 2017-10-17 NOTE — ED Triage Notes (Addendum)
Pt bib [facility] from home d/t N/V/D since approx 11pm last night. Pt reports approx 6 episodes emesis, 1 episode diarrhea. Received 4 mg zofran in route. A&O, VSS, pt appears weak and pale. CBG 191. Pt denies known exposure to same sx. Denies recent diet changes

## 2017-10-17 NOTE — ED Notes (Signed)
Pt's husband in rm states pt "passed out this morning", husband states "they were talking and she passed out on the couch." Pt states "I don't remember passing out" which pt did not mention on arrival. Husband denies pt hitting head. Pt once again states "I did not know I did that."

## 2017-10-17 NOTE — Discharge Instructions (Signed)
Return to the ER for new, worsening, or persistent vomiting, fevers, weakness, feeling like you are going to pass out or a recurrent episode of passing out, abdominal pain, or any other new or worsening symptoms that concern you.

## 2017-11-05 ENCOUNTER — Other Ambulatory Visit: Payer: Self-pay | Admitting: Family Medicine

## 2017-11-06 NOTE — Telephone Encounter (Signed)
CPE was on 04/29/17, last filled on 10/22/16 #30 g with 0 refills, please advise

## 2018-04-23 ENCOUNTER — Telehealth: Payer: Self-pay | Admitting: Family Medicine

## 2018-04-23 DIAGNOSIS — Z0000 Encounter for general adult medical examination without abnormal findings: Secondary | ICD-10-CM

## 2018-04-23 DIAGNOSIS — E7800 Pure hypercholesterolemia, unspecified: Secondary | ICD-10-CM

## 2018-04-23 NOTE — Telephone Encounter (Signed)
-----   Message from Lendon Collar, RT sent at 04/14/2018 11:14 AM EDT ----- Regarding: Lab orders for Friday 04/24/18 Please enter CPE lab orders for 04/24/18. Thanks-Lauren

## 2018-04-24 ENCOUNTER — Other Ambulatory Visit (INDEPENDENT_AMBULATORY_CARE_PROVIDER_SITE_OTHER): Payer: Self-pay

## 2018-04-24 DIAGNOSIS — Z0000 Encounter for general adult medical examination without abnormal findings: Secondary | ICD-10-CM

## 2018-04-24 DIAGNOSIS — E7800 Pure hypercholesterolemia, unspecified: Secondary | ICD-10-CM

## 2018-04-24 LAB — CBC WITH DIFFERENTIAL/PLATELET
BASOS PCT: 2.5 % (ref 0.0–3.0)
Basophils Absolute: 0.1 10*3/uL (ref 0.0–0.1)
EOS ABS: 0.1 10*3/uL (ref 0.0–0.7)
Eosinophils Relative: 3.8 % (ref 0.0–5.0)
HEMATOCRIT: 40.9 % (ref 36.0–46.0)
Hemoglobin: 13.6 g/dL (ref 12.0–15.0)
Lymphocytes Relative: 30 % (ref 12.0–46.0)
Lymphs Abs: 1.1 10*3/uL (ref 0.7–4.0)
MCHC: 33.4 g/dL (ref 30.0–36.0)
MCV: 91.9 fl (ref 78.0–100.0)
MONOS PCT: 9.5 % (ref 3.0–12.0)
Monocytes Absolute: 0.3 10*3/uL (ref 0.1–1.0)
NEUTROS ABS: 2 10*3/uL (ref 1.4–7.7)
Neutrophils Relative %: 54.2 % (ref 43.0–77.0)
PLATELETS: 255 10*3/uL (ref 150.0–400.0)
RBC: 4.45 Mil/uL (ref 3.87–5.11)
RDW: 13.8 % (ref 11.5–15.5)
WBC: 3.6 10*3/uL — ABNORMAL LOW (ref 4.0–10.5)

## 2018-04-24 LAB — COMPREHENSIVE METABOLIC PANEL
ALT: 12 U/L (ref 0–35)
AST: 15 U/L (ref 0–37)
Albumin: 4.4 g/dL (ref 3.5–5.2)
Alkaline Phosphatase: 64 U/L (ref 39–117)
BUN: 12 mg/dL (ref 6–23)
CHLORIDE: 103 meq/L (ref 96–112)
CO2: 30 meq/L (ref 19–32)
CREATININE: 1.01 mg/dL (ref 0.40–1.20)
Calcium: 9.7 mg/dL (ref 8.4–10.5)
GFR: 59.17 mL/min — ABNORMAL LOW (ref >60.00)
GLUCOSE: 98 mg/dL (ref 70–99)
Potassium: 4.7 mEq/L (ref 3.5–5.1)
Sodium: 139 mEq/L (ref 135–145)
Total Bilirubin: 0.7 mg/dL (ref 0.2–1.2)
Total Protein: 7.2 g/dL (ref 6.0–8.3)

## 2018-04-24 LAB — LIPID PANEL
CHOL/HDL RATIO: 3
Cholesterol: 196 mg/dL (ref 0–200)
HDL: 56.3 mg/dL (ref >39.00)
LDL CALC: 125 mg/dL — AB (ref 0–99)
NONHDL: 139.9
TRIGLYCERIDES: 76 mg/dL (ref 0.0–149.0)
VLDL: 15.2 mg/dL (ref 0.0–40.0)

## 2018-04-24 LAB — TSH: TSH: 2.12 u[IU]/mL (ref 0.35–4.50)

## 2018-05-01 ENCOUNTER — Ambulatory Visit: Payer: Self-pay | Admitting: Family Medicine

## 2018-05-01 ENCOUNTER — Encounter: Payer: Self-pay | Admitting: Family Medicine

## 2018-05-01 VITALS — BP 104/72 | HR 57 | Temp 98.1°F | Ht 61.5 in | Wt 120.8 lb

## 2018-05-01 DIAGNOSIS — Z0000 Encounter for general adult medical examination without abnormal findings: Secondary | ICD-10-CM

## 2018-05-01 DIAGNOSIS — L309 Dermatitis, unspecified: Secondary | ICD-10-CM

## 2018-05-01 DIAGNOSIS — E7800 Pure hypercholesterolemia, unspecified: Secondary | ICD-10-CM

## 2018-05-01 DIAGNOSIS — Z1231 Encounter for screening mammogram for malignant neoplasm of breast: Secondary | ICD-10-CM | POA: Insufficient documentation

## 2018-05-01 NOTE — Progress Notes (Signed)
Subjective:    Patient ID: Katherine Macdonald, female    DOB: 04-04-1956, 62 y.o.   MRN: [id number]  HPI Here for health maintenance exam and to review chronic medical problems    Doing well   Skin issues- seeing dermatology and patch test showed all to fragrance and balsam of [place]  Starting to figure it out be eliminating products  Has to use a fair amt of steroid cream    Wt Readings from Last 3 Encounters:  05/01/18 120 lb 12 oz (54.8 kg)  10/17/17 120 lb (54.4 kg)  04/29/17 124 lb 8 oz (56.5 kg)  good wt mt-stable  22.45 kg/m   Declines flu vaccine - declines   Mammogram 10/18 -she will schedule herself  Self breast exam -no changes or lumps   Pap 7/17 -has not had one since / on 3 y plan  Sees gyn Has pessary No symptoms or problems  No new sexual partners  No vaginal bleeding   Colonoscopy 8/14 with 10 y recall   Tetanus vaccine 5/16  BP Readings from Last 3 Encounters:  05/01/18 104/72  10/17/17 110/61  04/29/17 126/68   Hyperlipidemia Lab Results  Component Value Date   CHOL 196 04/24/2018   CHOL 213 (H) 04/23/2017   CHOL 189 01/17/2015   Lab Results  Component Value Date   HDL 56.30 04/24/2018   HDL 54.60 04/23/2017   HDL 49.40 01/17/2015   Lab Results  Component Value Date   LDLCALC 125 (H) 04/24/2018   LDLCALC 141 (H) 04/23/2017   LDLCALC 119 (H) 01/17/2015   Lab Results  Component Value Date   TRIG 76.0 04/24/2018   TRIG 89.0 04/23/2017   TRIG 104.0 01/17/2015   Lab Results  Component Value Date   CHOLHDL 3 04/24/2018   CHOLHDL 4 04/23/2017   CHOLHDL 4 01/17/2015   Lab Results  Component Value Date   LDLDIRECT 133.0 01/06/2013   Ratio is improved  Not much vasc dz in the family    Other labs: Results for orders placed or performed in visit on 04/24/18  TSH  Result Value Ref Range   TSH 2.12 0.35 - 4.50 uIU/mL  Lipid panel  Result Value Ref Range   Cholesterol 196 0 - 200 mg/dL   Triglycerides 76.0 0.0 - 149.0 mg/dL     HDL 56.30 >39.00 mg/dL   VLDL 15.2 0.0 - 40.0 mg/dL   LDL Cholesterol 125 (H) 0 - 99 mg/dL   Total CHOL/HDL Ratio 3    NonHDL 139.90   Comprehensive metabolic panel  Result Value Ref Range   Sodium 139 135 - 145 mEq/L   Potassium 4.7 3.5 - 5.1 mEq/L   Chloride 103 96 - 112 mEq/L   CO2 30 19 - 32 mEq/L   Glucose, Bld 98 70 - 99 mg/dL   BUN 12 6 - 23 mg/dL   Creatinine, Ser 1.01 0.40 - 1.20 mg/dL   Total Bilirubin 0.7 0.2 - 1.2 mg/dL   Alkaline Phosphatase 64 39 - 117 U/L   AST 15 0 - 37 U/L   ALT 12 0 - 35 U/L   Total Protein 7.2 6.0 - 8.3 g/dL   Albumin 4.4 3.5 - 5.2 g/dL   Calcium 9.7 8.4 - 10.5 mg/dL   GFR 59.17 (L) >60.00 mL/min  CBC with Differential/Platelet  Result Value Ref Range   WBC 3.6 (L) 4.0 - 10.5 K/uL   RBC 4.45 3.87 - 5.11 Mil/uL   Hemoglobin 13.6 12.0 -  15.0 g/dL   HCT 40.9 36.0 - 46.0 %   MCV 91.9 78.0 - 100.0 fl   MCHC 33.4 30.0 - 36.0 g/dL   RDW 13.8 11.5 - 15.5 %   Platelets 255.0 150.0 - 400.0 K/uL   Neutrophils Relative % 54.2 43.0 - 77.0 %   Lymphocytes Relative 30.0 12.0 - 46.0 %   Monocytes Relative 9.5 3.0 - 12.0 %   Eosinophils Relative 3.8 0.0 - 5.0 %   Basophils Relative 2.5 0.0 - 3.0 %   Neutro Abs 2.0 1.4 - 7.7 K/uL   Lymphs Abs 1.1 0.7 - 4.0 K/uL   Monocytes Absolute 0.3 0.1 - 1.0 K/uL   Eosinophils Absolute 0.1 0.0 - 0.7 K/uL   Basophils Absolute 0.1 0.0 - 0.1 K/uL    Patient Active Problem List   Diagnosis Date Noted  . Screening mammogram, encounter for 05/01/2018  . Dermatitis 10/22/2016  . History of cholecystectomy 12/21/2013  . Chronic buttock pain 01/10/2012  . Routine general medical examination at a health care facility 01/02/2012  . Colon cancer screening 11/27/2010  . HEADACHE 07/06/2010  . HYPERCHOLESTEROLEMIA 01/30/2010  . Uterine prolapse 11/15/2008  . MENOPAUSAL SYNDROME 11/15/2008  . SLEEP DISORDER 12/25/2007  . History of Helicobacter pylori infection 10/05/2007  . GERD 10/05/2007  . OVERACTIVE BLADDER  10/05/2007  . FIBROCYSTIC BREAST DISEASE 10/05/2007  . HYPERHIDROSIS 10/05/2007   Past Medical History:  Diagnosis Date  . Carotid bruit 09/2007   carotid doppler- normal  . Female bladder prolapse   . GERD (gastroesophageal reflux disease)   . Overactive bladder   . Presence of pessary   . Sleep disorder    Past Surgical History:  Procedure Laterality Date  . BUNIONECTOMY     right foot  . CHOLECYSTECTOMY     Social History   Tobacco Use  . Smoking status: Never Smoker  . Smokeless tobacco: Never Used  Substance Use Topics  . Alcohol use: No    Alcohol/week: 0.0 standard drinks  . Drug use: No   Family History  Problem Relation Age of Onset  . Arthritis Mother        Rheumatoid  . Heart disease Father        CABG  . Hyperlipidemia Father   . Cancer Maternal Aunt        breast   Allergies  Allergen Reactions  . Codeine     REACTION: GI upset  . Sulfonamide Derivatives     REACTION: hives  . Zolpidem Tartrate     REACTION: ? headache   Current Outpatient Medications on File Prior to Visit  Medication Sig Dispense Refill  . mometasone (ELOCON) 0.1 % cream APPLY TO AFFECTED AREAS EVERY DAY AS DIRECTED 45 g 1  . Multiple Vitamin (MULTIVITAMIN) tablet Take 1 tablet by mouth daily.      Marland Kitchen OVER THE COUNTER MEDICATION Doterra vitamins: takes daily     No current facility-administered medications on file prior to visit.     Review of Systems  Constitutional: Negative for activity change, appetite change, fatigue, fever and unexpected weight change.  HENT: Negative for congestion, ear pain, rhinorrhea, sinus pressure and sore throat.   Eyes: Negative for pain, redness and visual disturbance.  Respiratory: Negative for cough, shortness of breath and wheezing.   Cardiovascular: Negative for chest pain and palpitations.  Gastrointestinal: Negative for abdominal pain, blood in stool, constipation and diarrhea.  Endocrine: Negative for polydipsia and polyuria.    Genitourinary: Negative for dysuria, frequency  and urgency.  Musculoskeletal: Negative for arthralgias, back pain and myalgias.  Skin: Negative for color change and pallor.       Chronic dermatitis   Allergic/Immunologic: Negative for environmental allergies.  Neurological: Negative for dizziness, syncope and headaches.  Hematological: Negative for adenopathy. Does not bruise/bleed easily.  Psychiatric/Behavioral: Negative for decreased concentration and dysphoric mood. The patient is not nervous/anxious.        Objective:   Physical Exam  Constitutional: She appears well-developed and well-nourished. No distress.  Well appearing   HENT:  Head: Normocephalic and atraumatic.  Right Ear: External ear normal.  Left Ear: External ear normal.  Mouth/Throat: Oropharynx is clear and moist.  Eyes: Pupils are equal, round, and reactive to light. Conjunctivae and EOM are normal. No scleral icterus.  Neck: Normal range of motion. Neck supple. No JVD present. Carotid bruit is not present. No thyromegaly present.  Cardiovascular: Normal rate, regular rhythm, normal heart sounds and intact distal pulses. Exam reveals no gallop.  Pulmonary/Chest: Effort normal and breath sounds normal. No respiratory distress. She has no wheezes. She exhibits no tenderness. No breast tenderness, discharge or bleeding.  Abdominal: Soft. Bowel sounds are normal. She exhibits no distension, no abdominal bruit and no mass. There is no tenderness.  Genitourinary: No breast tenderness, discharge or bleeding.  Genitourinary Comments: Breast exam: No mass, nodules, thickening, tenderness, bulging, retraction, inflamation, nipple discharge or skin changes noted.  No axillary or clavicular LA.      Musculoskeletal: Normal range of motion. She exhibits no edema, tenderness or deformity.  Lymphadenopathy:    She has no cervical adenopathy.  Neurological: She is alert. She has normal reflexes. She displays normal reflexes. No  cranial nerve deficit. She exhibits normal muscle tone. Coordination normal.  Skin: Skin is warm and dry. No rash noted. No erythema. No pallor.  Mildly tanned  Some areas of eczema type dermatitis  Few scattered stable nevi on back  Psychiatric: She has a normal mood and affect.  Cheerful and talkative           Assessment & Plan:   Problem List Items Addressed This Visit      Musculoskeletal and Integument   Dermatitis    Recent patch test from derm-fragrance and balsam of [place] reaction  Working on exposures         Other   HYPERCHOLESTEROLEMIA    Disc goals for lipids and reasons to control them Rev last labs with pt Rev low sat fat diet in detail LDL is down and good HDL  Ratio of 3 Urged to keep working on it       Routine general medical examination at a health care facility - Primary    Reviewed health habits including diet and exercise and skin cancer prevention Reviewed appropriate screening tests for age  Also reviewed health mt list, fam hx and immunization status , as well as social and family history   See HPI Labs reviewed Referred for mammogram at [place] to continue exercise and good habits Declines flu shots- enc her to update [place] if she changes her mind        Screening mammogram, encounter for    Scheduled annual screening mammogram Nl breast exam today  Encouraged monthly self exams        Relevant Orders   MM 3D SCREEN BREAST BILATERAL

## 2018-05-01 NOTE — Assessment & Plan Note (Signed)
Disc goals for lipids and reasons to control them Rev last labs with pt Rev low sat fat diet in detail LDL is down and good HDL  Ratio of 3 Urged to keep working on it

## 2018-05-01 NOTE — Assessment & Plan Note (Signed)
Recent patch test from derm-fragrance and balsam of [place] reaction  Working on exposures

## 2018-05-01 NOTE — Assessment & Plan Note (Signed)
Scheduled annual screening mammogram Nl breast exam today  Encouraged monthly self exams   

## 2018-05-01 NOTE — Patient Instructions (Addendum)
Take care of yourself  Cholesterol is improved -keep working on it Avoid red meat/ fried foods/ egg yolks/ fatty breakfast meats/ butter, cheese and high fat dairy/ and shellfish    Stay active -keep exercising   We will schedule a mammogram at check out   If you change your mind about a flu shot let us know

## 2018-05-01 NOTE — Assessment & Plan Note (Addendum)
Reviewed health habits including diet and exercise and skin cancer prevention Reviewed appropriate screening tests for age  Also reviewed health mt list, fam hx and immunization status , as well as social and family history   See HPI Labs reviewed Referred for mammogram at [place] to continue exercise and good habits Declines flu shots- enc her to update [place] if she changes her mind

## 2018-06-08 ENCOUNTER — Ambulatory Visit
Admission: RE | Admit: 2018-06-08 | Discharge: 2018-06-08 | Disposition: A | Discharge status: Home / Self Care | Payer: Self-pay | Source: Ambulatory Visit | Attending: Family Medicine | Admitting: Family Medicine

## 2018-06-08 DIAGNOSIS — Z1231 Encounter for screening mammogram for malignant neoplasm of breast: Secondary | ICD-10-CM

## 2018-12-28 IMAGING — MG MM DIGITAL SCREENING BILAT W/ TOMO W/ CAD
6 of 10 series · 6 of 30 positions shown · non-contrast
Comparison: Previous exam(s).

CLINICAL DATA: Screening.

EXAM:
DIGITAL SCREENING BILATERAL MAMMOGRAM WITH TOMO AND CAD

[R CC synth-2D]
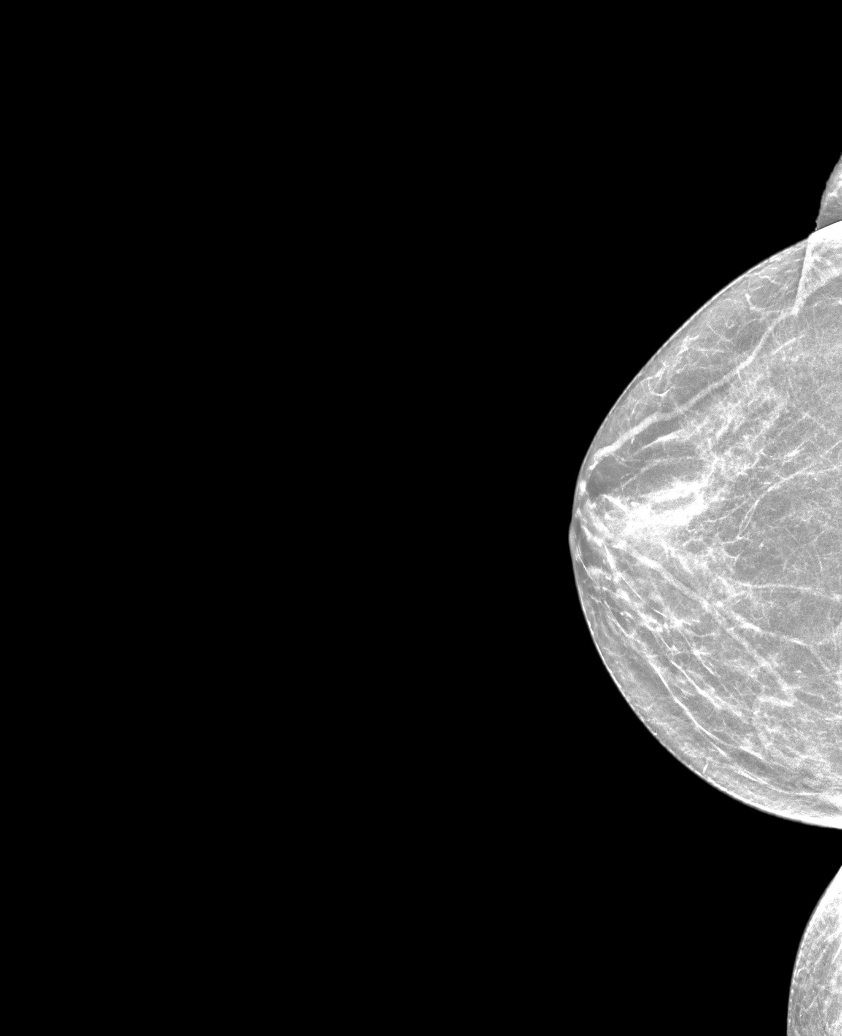

[L MLO synth-2D (1 of 2)]
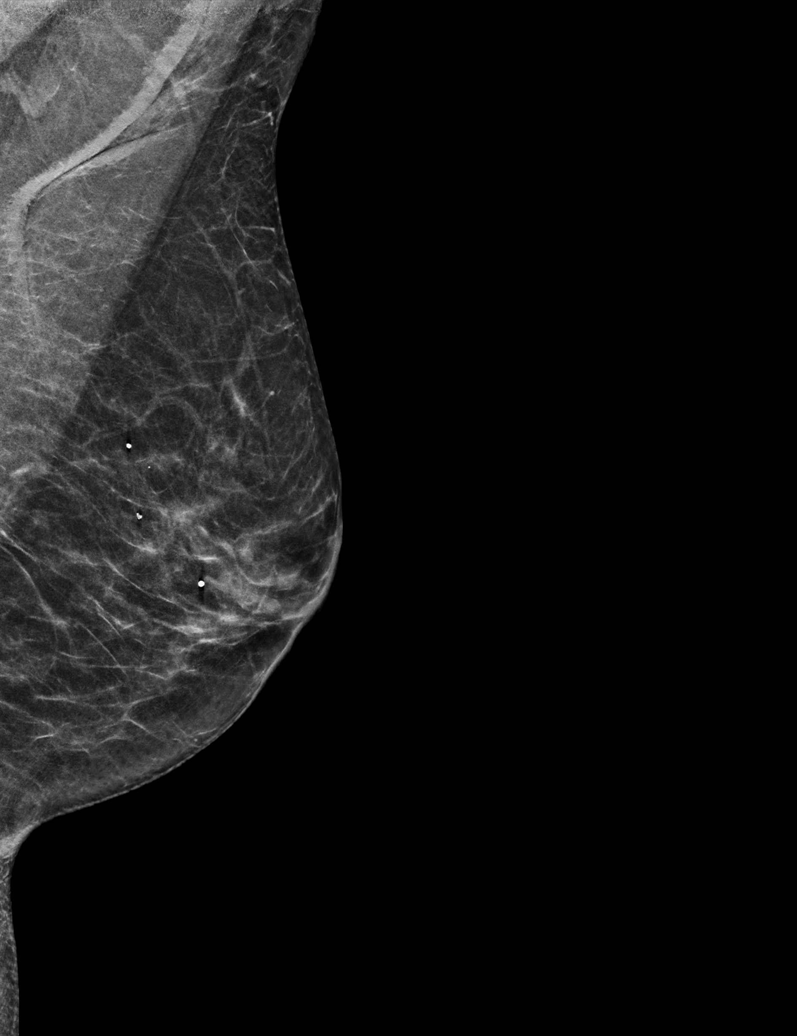

[L CC synth-2D]
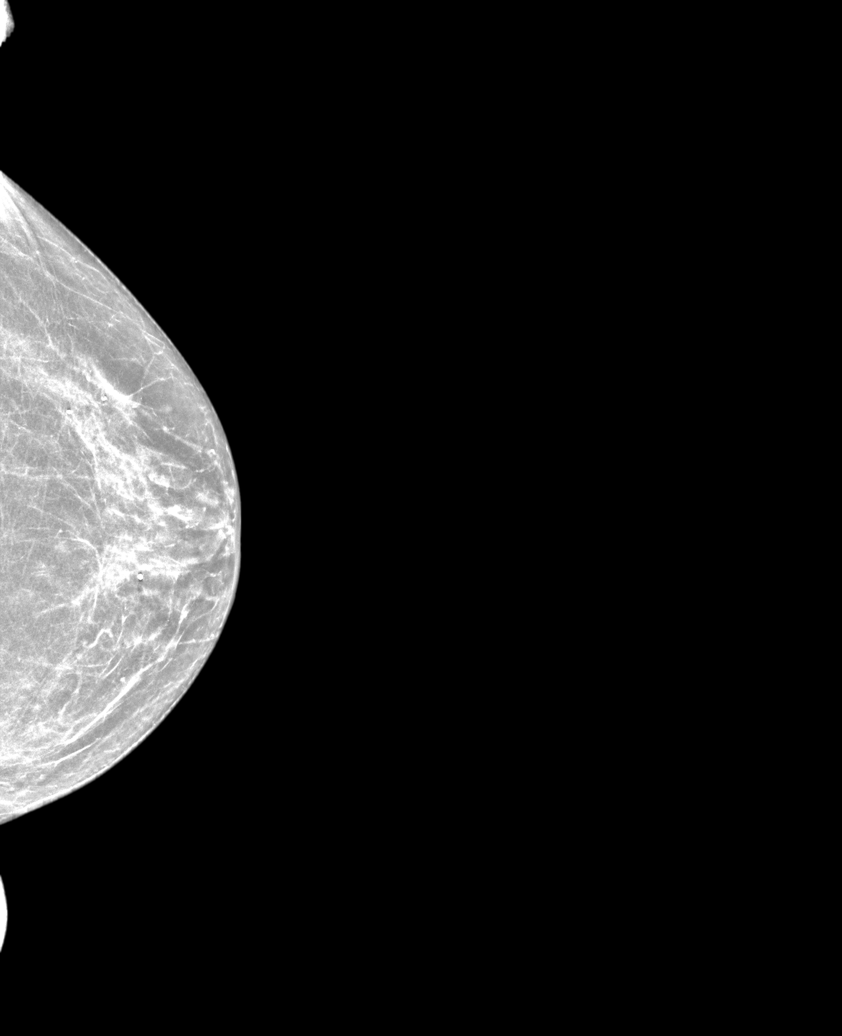

[R MLO synth-2D]
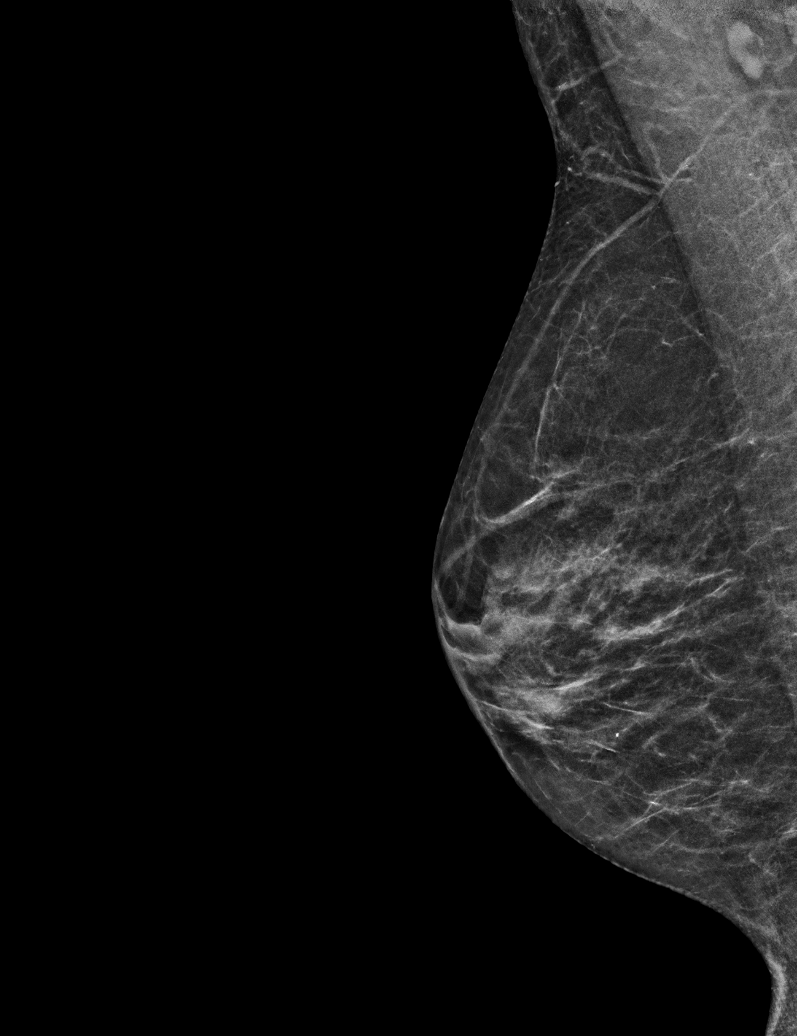

[L MLO synth-2D (2 of 2)]
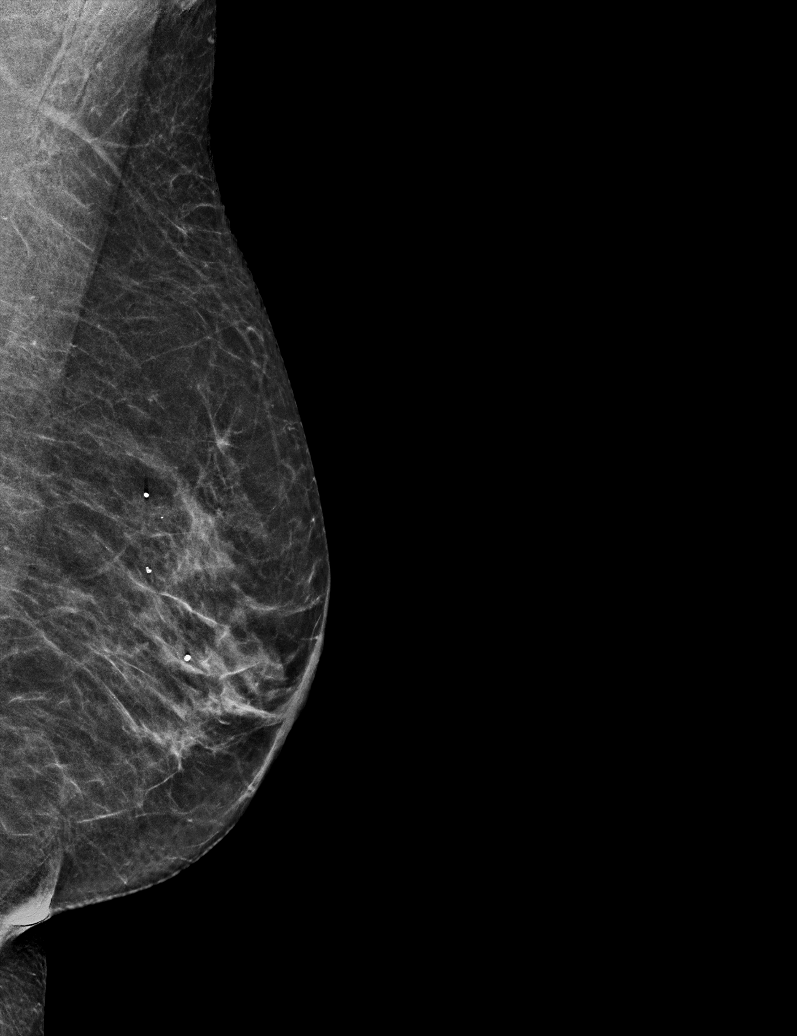

[L MLO tomo · tomo slice 25/48.0]
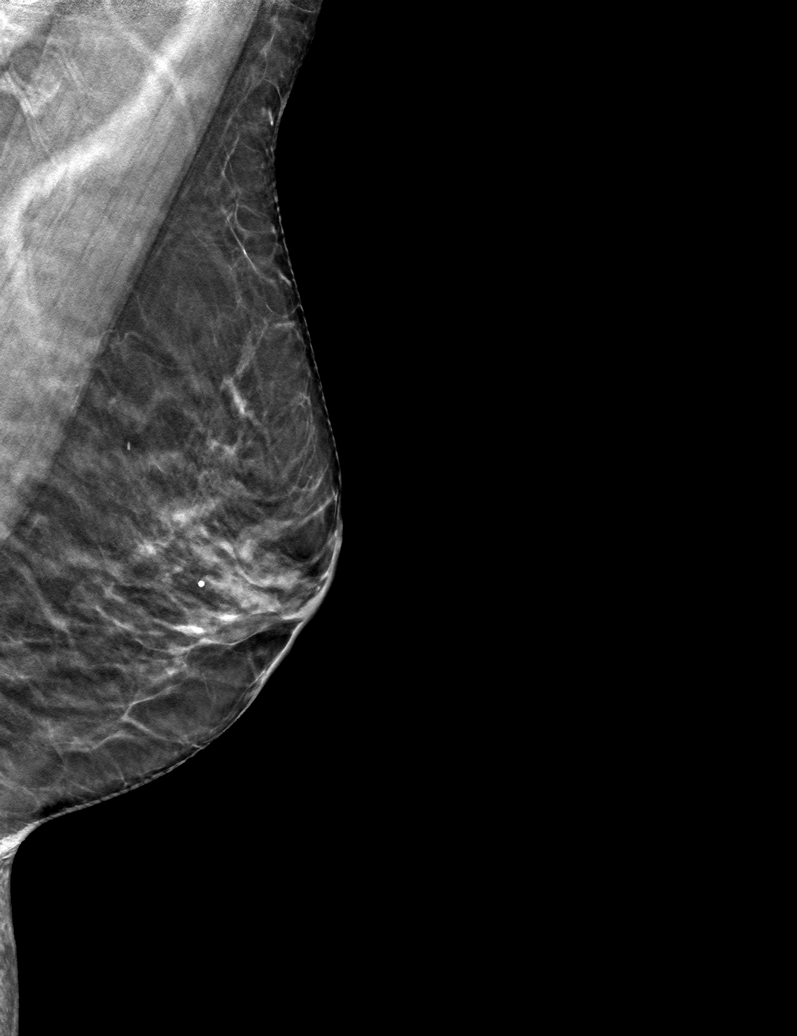

[6 of 30 positions shown; findings below may reference images not displayed]

ACR Breast Density Category b: There are scattered areas of
fibroglandular density.
FINDINGS: There are no findings suspicious for malignancy. Images were
processed with CAD.
IMPRESSION: No mammographic evidence of malignancy. A result letter of this
screening mammogram will be mailed directly to the patient.

RECOMMENDATION:
Screening mammogram in one year. (Code:[id number])

BI-RADS CATEGORY  1: Negative.

## 2019-04-27 ENCOUNTER — Telehealth: Payer: Self-pay | Admitting: Family Medicine

## 2019-04-27 DIAGNOSIS — Z0000 Encounter for general adult medical examination without abnormal findings: Secondary | ICD-10-CM

## 2019-04-27 DIAGNOSIS — E7800 Pure hypercholesterolemia, unspecified: Secondary | ICD-10-CM

## 2019-04-27 NOTE — Telephone Encounter (Signed)
-----   Message from Ellamae Sia sent at 04/20/2019 11:38 AM EDT ----- Regarding: Lab orders for Wednesday, 9.2.20 Patient is scheduled for CPX labs, please order future labs, Thanks , Karna Christmas

## 2019-04-28 ENCOUNTER — Other Ambulatory Visit: Payer: Self-pay

## 2019-04-28 ENCOUNTER — Other Ambulatory Visit (INDEPENDENT_AMBULATORY_CARE_PROVIDER_SITE_OTHER): Payer: Self-pay

## 2019-04-28 DIAGNOSIS — E7800 Pure hypercholesterolemia, unspecified: Secondary | ICD-10-CM

## 2019-04-28 DIAGNOSIS — Z0000 Encounter for general adult medical examination without abnormal findings: Secondary | ICD-10-CM

## 2019-04-28 LAB — LIPID PANEL
Cholesterol: 215 mg/dL — ABNORMAL HIGH (ref 0–200)
HDL: 61 mg/dL (ref >39.00)
LDL Cholesterol: 136 mg/dL — ABNORMAL HIGH (ref 0–99)
NonHDL: 153.74
Total CHOL/HDL Ratio: 4
Triglycerides: 87 mg/dL (ref 0.0–149.0)
VLDL: 17.4 mg/dL (ref 0.0–40.0)

## 2019-04-28 LAB — COMPREHENSIVE METABOLIC PANEL
ALT: 18 U/L (ref 0–35)
AST: 18 U/L (ref 0–37)
Albumin: 4.4 g/dL (ref 3.5–5.2)
Alkaline Phosphatase: 69 U/L (ref 39–117)
BUN: 18 mg/dL (ref 6–23)
CO2: 32 mEq/L (ref 19–32)
Calcium: 9.8 mg/dL (ref 8.4–10.5)
Chloride: 103 mEq/L (ref 96–112)
Creatinine, Ser: 1.02 mg/dL (ref 0.40–1.20)
GFR: 54.86 mL/min — ABNORMAL LOW (ref >60.00)
Glucose, Bld: 93 mg/dL (ref 70–99)
Potassium: 5.2 mEq/L — ABNORMAL HIGH (ref 3.5–5.1)
Sodium: 139 mEq/L (ref 135–145)
Total Bilirubin: 0.7 mg/dL (ref 0.2–1.2)
Total Protein: 7.2 g/dL (ref 6.0–8.3)

## 2019-04-28 LAB — CBC WITH DIFFERENTIAL/PLATELET
Basophils Absolute: 0 10*3/uL (ref 0.0–0.1)
Basophils Relative: 1 % (ref 0.0–3.0)
Eosinophils Absolute: 0.2 10*3/uL (ref 0.0–0.7)
Eosinophils Relative: 4 % (ref 0.0–5.0)
HCT: 40.4 % (ref 36.0–46.0)
Hemoglobin: 13.6 g/dL (ref 12.0–15.0)
Lymphocytes Relative: 31.5 % (ref 12.0–46.0)
Lymphs Abs: 1.3 10*3/uL (ref 0.7–4.0)
MCHC: 33.8 g/dL (ref 30.0–36.0)
MCV: 92 fl (ref 78.0–100.0)
Monocytes Absolute: 0.4 10*3/uL (ref 0.1–1.0)
Monocytes Relative: 8.7 % (ref 3.0–12.0)
Neutro Abs: 2.2 10*3/uL (ref 1.4–7.7)
Neutrophils Relative %: 54.8 % (ref 43.0–77.0)
Platelets: 239 10*3/uL (ref 150.0–400.0)
RBC: 4.39 Mil/uL (ref 3.87–5.11)
RDW: 13.6 % (ref 11.5–15.5)
WBC: 4 10*3/uL (ref 4.0–10.5)

## 2019-04-28 LAB — TSH: TSH: 3.17 u[IU]/mL (ref 0.35–4.50)

## 2019-05-05 ENCOUNTER — Other Ambulatory Visit: Payer: Self-pay

## 2019-05-05 ENCOUNTER — Ambulatory Visit (INDEPENDENT_AMBULATORY_CARE_PROVIDER_SITE_OTHER): Payer: Self-pay | Admitting: Family Medicine

## 2019-05-05 ENCOUNTER — Encounter: Payer: Self-pay | Admitting: Family Medicine

## 2019-05-05 VITALS — BP 128/64 | HR 66 | Temp 98.3°F | Ht 61.5 in | Wt 125.0 lb

## 2019-05-05 DIAGNOSIS — Z0000 Encounter for general adult medical examination without abnormal findings: Secondary | ICD-10-CM

## 2019-05-05 DIAGNOSIS — N814 Uterovaginal prolapse, unspecified: Secondary | ICD-10-CM

## 2019-05-05 DIAGNOSIS — E7800 Pure hypercholesterolemia, unspecified: Secondary | ICD-10-CM

## 2019-05-05 NOTE — Assessment & Plan Note (Signed)
Continues to use pessary Due for gyn f/u

## 2019-05-05 NOTE — Assessment & Plan Note (Signed)
Disc goals for lipids and reasons to control them Rev last labs with pt Rev low sat fat diet in detail LDL is creeping up  Disc possible genetic high lipids-may need statin in future Will watch diet closely and re assess  Has a fam hx

## 2019-05-05 NOTE — Patient Instructions (Addendum)
Get to gyn for your routine exam and pessary   Don't forget to schedule your mammogram   For cholesterol Avoid red meat/ fried foods/ egg yolks/ fatty breakfast meats/ butter, cheese and high fat dairy/ and shellfish    Stop any vitamins with potassium in them    Try to get 1200-1500 mg of calcium per day with at least 1000 iu of vitamin D - for bone health   Take care of yourself  Keep walking

## 2019-05-05 NOTE — Progress Notes (Signed)
Subjective:    Patient ID: Katherine Macdonald, female    DOB: 1955-09-11, 63 y.o.   MRN: [id number]  HPI Here for health maintenance exam and to review chronic medical problems    Feeling good overall   Pap 7/17 -due for gyn visit  Sees gyn  Has had a pessary  Flu vaccine -by choice declines  She has never had the flu  Declines vaccines in general (all except tetanus)-cannot explain fully why  Mammogram 10/19 - she goes to [facility]  She plans to schedule this herself  Self breast exam -no lumps   Colonoscopy 8/14   Tdap 5/16   Zoster status -never had shingles and declines vaccines   Mother was diagnosed with beginning stages of dementia -this has been hard    Wt Readings from Last 3 Encounters:  05/05/19 125 lb (56.7 kg)  01/05/19 120 lb (54.4 kg)  05/01/18 120 lb 12 oz (54.8 kg)  walking faithfully -doing very well with it  About 3 miles per day  Eats healthy as well  23.24 kg/m   BP Readings from Last 3 Encounters:  05/05/19 128/64  05/01/18 104/72  10/17/17 110/61   Pulse Readings from Last 3 Encounters:  05/05/19 66  05/01/18 (!) 57  10/17/17 76   Hyperlipidemia Lab Results  Component Value Date   CHOL 215 (H) 04/28/2019   CHOL 196 04/24/2018   CHOL 213 (H) 04/23/2017   Lab Results  Component Value Date   HDL 61.00 04/28/2019   HDL 56.30 04/24/2018   HDL 54.60 04/23/2017   Lab Results  Component Value Date   LDLCALC 136 (H) 04/28/2019   LDLCALC 125 (H) 04/24/2018   LDLCALC 141 (H) 04/23/2017   Lab Results  Component Value Date   TRIG 87.0 04/28/2019   TRIG 76.0 04/24/2018   TRIG 89.0 04/23/2017   Lab Results  Component Value Date   CHOLHDL 4 04/28/2019   CHOLHDL 3 04/24/2018   CHOLHDL 4 04/23/2017   Lab Results  Component Value Date   LDLDIRECT 133.0 01/06/2013  she has cut out red meat  No fried foods  Some butter/cheese  No shellfish  Mother had high cholesterol  Father had vascular issues    PHQ 2 :0    Mood is good  Other labs Results for orders placed or performed in visit on 04/28/19  TSH  Result Value Ref Range   TSH 3.17 0.35 - 4.50 uIU/mL  Lipid panel  Result Value Ref Range   Cholesterol 215 (H) 0 - 200 mg/dL   Triglycerides 87.0 0.0 - 149.0 mg/dL   HDL 61.00 >39.00 mg/dL   VLDL 17.4 0.0 - 40.0 mg/dL   LDL Cholesterol 136 (H) 0 - 99 mg/dL   Total CHOL/HDL Ratio 4    NonHDL 153.74   Comprehensive metabolic panel  Result Value Ref Range   Sodium 139 135 - 145 mEq/L   Potassium 5.2 (H) 3.5 - 5.1 mEq/L   Chloride 103 96 - 112 mEq/L   CO2 32 19 - 32 mEq/L   Glucose, Bld 93 70 - 99 mg/dL   BUN 18 6 - 23 mg/dL   Creatinine, Ser 1.02 0.40 - 1.20 mg/dL   Total Bilirubin 0.7 0.2 - 1.2 mg/dL   Alkaline Phosphatase 69 39 - 117 U/L   AST 18 0 - 37 U/L   ALT 18 0 - 35 U/L   Total Protein 7.2 6.0 - 8.3 g/dL   Albumin 4.4 3.5 -  5.2 g/dL   Calcium 9.8 8.4 - 10.5 mg/dL   GFR 54.86 (L) >60.00 mL/min  CBC with Differential/Platelet  Result Value Ref Range   WBC 4.0 4.0 - 10.5 K/uL   RBC 4.39 3.87 - 5.11 Mil/uL   Hemoglobin 13.6 12.0 - 15.0 g/dL   HCT 40.4 36.0 - 46.0 %   MCV 92.0 78.0 - 100.0 fl   MCHC 33.8 30.0 - 36.0 g/dL   RDW 13.6 11.5 - 15.5 %   Platelets 239.0 150.0 - 400.0 K/uL   Neutrophils Relative % 54.8 43.0 - 77.0 %   Lymphocytes Relative 31.5 12.0 - 46.0 %   Monocytes Relative 8.7 3.0 - 12.0 %   Eosinophils Relative 4.0 0.0 - 5.0 %   Basophils Relative 1.0 0.0 - 3.0 %   Neutro Abs 2.2 1.4 - 7.7 K/uL   Lymphs Abs 1.3 0.7 - 4.0 K/uL   Monocytes Absolute 0.4 0.1 - 1.0 K/uL   Eosinophils Absolute 0.2 0.0 - 0.7 K/uL   Basophils Absolute 0.0 0.0 - 0.1 K/uL     K is slightly high  Thinks she may take some supplements with K in them   Patient Active Problem List   Diagnosis Date Noted  . Screening mammogram, encounter for 05/01/2018  . Dermatitis 10/22/2016  . History of cholecystectomy 12/21/2013  . Chronic buttock pain 01/10/2012  . Routine general medical  examination at a health care facility 01/02/2012  . Colon cancer screening 11/27/2010  . HEADACHE 07/06/2010  . HYPERCHOLESTEROLEMIA 01/30/2010  . Uterine prolapse 11/15/2008  . MENOPAUSAL SYNDROME 11/15/2008  . SLEEP DISORDER 12/25/2007  . History of Helicobacter pylori infection 10/05/2007  . GERD 10/05/2007  . OVERACTIVE BLADDER 10/05/2007  . FIBROCYSTIC BREAST DISEASE 10/05/2007  . HYPERHIDROSIS 10/05/2007   Past Medical History:  Diagnosis Date  . Carotid bruit 09/2007   carotid doppler- normal  . Female bladder prolapse   . GERD (gastroesophageal reflux disease)   . Overactive bladder   . Presence of pessary   . Sleep disorder    Past Surgical History:  Procedure Laterality Date  . BUNIONECTOMY     right foot  . CHOLECYSTECTOMY     Social History   Tobacco Use  . Smoking status: Never Smoker  . Smokeless tobacco: Never Used  Substance Use Topics  . Alcohol use: No    Alcohol/week: 0.0 standard drinks  . Drug use: No   Family History  Problem Relation Age of Onset  . Arthritis Mother        Rheumatoid  . Heart disease Father        CABG  . Hyperlipidemia Father   . Cancer Maternal Aunt        breast  . Breast cancer Maternal Aunt    Allergies  Allergen Reactions  . Codeine     REACTION: GI upset  . Meloxicam   . Sulfonamide Derivatives     REACTION: hives  . Zolpidem Tartrate     REACTION: ? headache   Current Outpatient Medications on File Prior to Visit  Medication Sig Dispense Refill  . mometasone (ELOCON) 0.1 % cream APPLY TO AFFECTED AREAS EVERY DAY AS DIRECTED 45 g 1  . Multiple Vitamin (MULTIVITAMIN) tablet Take 1 tablet by mouth daily.      Marland Kitchen OVER THE COUNTER MEDICATION Doterra vitamins: takes daily     No current facility-administered medications on file prior to visit.      Review of Systems  Constitutional: Negative for activity  change, appetite change, fatigue, fever and unexpected weight change.  HENT: Negative for congestion,  ear pain, rhinorrhea, sinus pressure and sore throat.   Eyes: Negative for pain, redness and visual disturbance.  Respiratory: Negative for cough, shortness of breath and wheezing.   Cardiovascular: Negative for chest pain and palpitations.  Gastrointestinal: Negative for abdominal pain, blood in stool, constipation and diarrhea.  Endocrine: Negative for polydipsia and polyuria.  Genitourinary: Negative for dysuria, frequency and urgency.  Musculoskeletal: Negative for arthralgias, back pain and myalgias.  Skin: Negative for pallor and rash.  Allergic/Immunologic: Negative for environmental allergies.  Neurological: Negative for dizziness, syncope and headaches.  Hematological: Negative for adenopathy. Does not bruise/bleed easily.  Psychiatric/Behavioral: Negative for decreased concentration and dysphoric mood. The patient is not nervous/anxious.        Objective:   Physical Exam Constitutional:      General: She is not in acute distress.    Appearance: Normal appearance. She is well-developed and normal weight. She is not ill-appearing or diaphoretic.  HENT:     Head: Normocephalic and atraumatic.     Right Ear: Tympanic membrane, ear canal and external ear normal.     Left Ear: Tympanic membrane, ear canal and external ear normal.     Nose: Nose normal. No congestion.     Mouth/Throat:     Mouth: Mucous membranes are moist.     Pharynx: Oropharynx is clear. No posterior oropharyngeal erythema.  Eyes:     General: No scleral icterus.    Extraocular Movements: Extraocular movements intact.     Conjunctiva/sclera: Conjunctivae normal.     Pupils: Pupils are equal, round, and reactive to light.  Neck:     Musculoskeletal: Normal range of motion and neck supple. No neck rigidity or muscular tenderness.     Thyroid: No thyromegaly.     Vascular: No carotid bruit or JVD.  Cardiovascular:     Rate and Rhythm: Normal rate and regular rhythm.     Pulses: Normal pulses.     Heart  sounds: Normal heart sounds. No gallop.   Pulmonary:     Effort: Pulmonary effort is normal. No respiratory distress.     Breath sounds: Normal breath sounds. No wheezing.     Comments: Good air exch Chest:     Chest wall: No tenderness.  Abdominal:     General: Bowel sounds are normal. There is no distension or abdominal bruit.     Palpations: Abdomen is soft. There is no mass.     Tenderness: There is no abdominal tenderness.     Hernia: No hernia is present.  Genitourinary:    Comments: Breast and pelvic exam done by gyn Musculoskeletal: Normal range of motion.        General: No tenderness.     Right lower leg: No edema.     Left lower leg: No edema.  Lymphadenopathy:     Cervical: No cervical adenopathy.  Skin:    General: Skin is warm and dry.     Coloration: Skin is not pale.     Findings: No erythema or rash.     Comments: Solar lentigines diffusely Mildly tanned  Neurological:     Mental Status: She is alert. Mental status is at baseline.     Cranial Nerves: No cranial nerve deficit.     Motor: No abnormal muscle tone.     Coordination: Coordination normal.     Gait: Gait normal.     Deep Tendon Reflexes: Reflexes  are normal and symmetric.  Psychiatric:        Mood and Affect: Mood normal.        Cognition and Memory: Cognition and memory normal.     Comments: Pleasant            Assessment & Plan:   Problem List Items Addressed This Visit      Genitourinary   Uterine prolapse    Continues to use pessary Due for gyn f/u        Other   HYPERCHOLESTEROLEMIA    Disc goals for lipids and reasons to control them Rev last labs with pt Rev low sat fat diet in detail LDL is creeping up  Disc possible genetic high lipids-may need statin in future Will watch diet closely and re assess  Has a fam hx      Routine general medical examination at a health care facility - Primary    Reviewed health habits including diet and exercise and skin cancer  prevention Reviewed appropriate screening tests for age  Also reviewed health mt list, fam hx and immunization status , as well as social and family history   See HPI Labs reviewed Declines vaccines (discussed risks with her) Plans to f/u with gyn for breast/pelvic exam and pessary adj, pap if needed  She plans to schedule her mammogram for October Enc continued good diet and exercise

## 2019-05-05 NOTE — Assessment & Plan Note (Signed)
Reviewed health habits including diet and exercise and skin cancer prevention Reviewed appropriate screening tests for age  Also reviewed health mt list, fam hx and immunization status , as well as social and family history   See HPI Labs reviewed Declines vaccines (discussed risks with her) Plans to f/u with gyn for breast/pelvic exam and pessary adj, pap if needed  She plans to schedule her mammogram for October Enc continued good diet and exercise

## 2019-05-13 ENCOUNTER — Other Ambulatory Visit: Payer: Self-pay | Admitting: Family Medicine

## 2019-05-13 DIAGNOSIS — Z1231 Encounter for screening mammogram for malignant neoplasm of breast: Secondary | ICD-10-CM

## 2019-06-22 ENCOUNTER — Ambulatory Visit
Admission: RE | Admit: 2019-06-22 | Discharge: 2019-06-22 | Disposition: A | Discharge status: Home / Self Care | Payer: Self-pay | Source: Ambulatory Visit | Attending: Family Medicine | Admitting: Family Medicine

## 2019-06-22 DIAGNOSIS — Z1231 Encounter for screening mammogram for malignant neoplasm of breast: Secondary | ICD-10-CM | POA: Insufficient documentation

## 2020-04-13 ENCOUNTER — Other Ambulatory Visit: Payer: Self-pay

## 2020-04-13 ENCOUNTER — Ambulatory Visit
Admission: RE | Admit: 2020-04-13 | Discharge: 2020-04-13 | Disposition: A | Discharge status: Home / Self Care | Payer: Self-pay | Source: Ambulatory Visit | Attending: Emergency Medicine | Admitting: Emergency Medicine

## 2020-04-13 VITALS — BP 117/63 | HR 66 | Temp 98.8°F | Resp 17 | Ht 62.0 in | Wt 125.0 lb

## 2020-04-13 DIAGNOSIS — R21 Rash and other nonspecific skin eruption: Secondary | ICD-10-CM

## 2020-04-13 MED ORDER — PREDNISONE 10 MG (21) PO TBPK: ORAL_TABLET | Freq: Every day | ORAL | 0 refills | Status: DC

## 2020-04-13 NOTE — ED Provider Notes (Signed)
Katherine Macdonald    CSN: [id number] Arrival date & time: 04/13/20  1648      History   Chief Complaint Chief Complaint  Patient presents with  . Rash    HPI Katherine Macdonald is a 64 y.o. female.   Patient presents with a pruritic rash on her trunk and extremities x10 days.  She states the rash started in her axilla and has spread to her chest, back, extremities.  She has had similar rash in the past and was seen by dermatology; treated with steroid cream.  She denies fever, chills, sore throat, cough, shortness of breath, vomiting, diarrhea, or other symptoms.  The history is provided by the patient.    Past Medical History:  Diagnosis Date  . Carotid bruit 09/2007   carotid doppler- normal  . Female bladder prolapse   . GERD (gastroesophageal reflux disease)   . Overactive bladder   . Presence of pessary   . Sleep disorder     Patient Active Problem List   Diagnosis Date Noted  . Screening mammogram, encounter for 05/01/2018  . Dermatitis 10/22/2016  . History of cholecystectomy 12/21/2013  . Chronic buttock pain 01/10/2012  . Routine general medical examination at a health care facility 01/02/2012  . Colon cancer screening 11/27/2010  . HEADACHE 07/06/2010  . HYPERCHOLESTEROLEMIA 01/30/2010  . Uterine prolapse 11/15/2008  . MENOPAUSAL SYNDROME 11/15/2008  . SLEEP DISORDER 12/25/2007  . History of Helicobacter pylori infection 10/05/2007  . GERD 10/05/2007  . OVERACTIVE BLADDER 10/05/2007  . FIBROCYSTIC BREAST DISEASE 10/05/2007  . HYPERHIDROSIS 10/05/2007    Past Surgical History:  Procedure Laterality Date  . BUNIONECTOMY     right foot  . CHOLECYSTECTOMY      OB History   No obstetric history on file.      Home Medications    Prior to Admission medications   Medication Sig Start Date End Date Taking? Authorizing Provider  mometasone (ELOCON) 0.1 % cream APPLY TO AFFECTED AREAS EVERY DAY AS DIRECTED 11/06/17  Yes Tower, Wynelle Fanny, MD    Multiple Vitamin (MULTIVITAMIN) tablet Take 1 tablet by mouth daily.     Yes [provider]  OVER THE COUNTER MEDICATION Doterra vitamins: takes daily   Yes [provider]  predniSONE (STERAPRED UNI-PAK 21 TAB) 10 MG (21) TBPK tablet Take by mouth daily. As directed 04/13/20   Sharion Balloon, NP    Family History Family History  Problem Relation Age of Onset  . Arthritis Mother        Rheumatoid  . Dementia Mother   . Heart disease Father        CABG  . Hyperlipidemia Father   . Cancer Maternal Aunt        breast  . Breast cancer Maternal Aunt     Social History Social History   Tobacco Use  . Smoking status: Never Smoker  . Smokeless tobacco: Never Used  Vaping Use  . Vaping Use: Never used  Substance Use Topics  . Alcohol use: No    Alcohol/week: 0.0 standard drinks  . Drug use: No     Allergies   Codeine, Meloxicam, Sulfonamide derivatives, and Zolpidem tartrate   Review of Systems Review of Systems  Constitutional: Negative for chills and fever.  HENT: Negative for ear pain and sore throat.   Eyes: Negative for pain and visual disturbance.  Respiratory: Negative for cough and shortness of breath.   Cardiovascular: Negative for chest pain and palpitations.  Gastrointestinal: Negative for abdominal pain and vomiting.  Genitourinary: Negative for dysuria and hematuria.  Musculoskeletal: Negative for arthralgias and back pain.  Skin: Positive for rash. Negative for color change.  Neurological: Negative for seizures and syncope.  All other systems reviewed and are negative.    Physical Exam Triage Vital Signs ED Triage Vitals  Enc Vitals Group     BP      Pulse      Resp      Temp      Temp src      SpO2      Weight      Height      Head Circumference      Peak Flow      Pain Score      Pain Loc      Pain Edu?      Excl. in [place]?    No data found.  Updated Vital Signs BP 117/63 (BP Location: Left Arm)   Pulse 66   Temp  98.8 F (37.1 C) (Oral)   Resp 17   Ht 5\' 2"  (1.575 m)   Wt 125 lb (56.7 kg)   LMP 08/26/2008   SpO2 97%   BMI 22.86 kg/m   Visual Acuity Right Eye Distance:   Left Eye Distance:   Bilateral Distance:    Right Eye Near:   Left Eye Near:    Bilateral Near:     Physical Exam Vitals and nursing note reviewed.  Constitutional:      General: She is not in acute distress.    Appearance: She is well-developed. She is not ill-appearing.  HENT:     Head: Normocephalic and atraumatic.     Mouth/Throat:     Mouth: Mucous membranes are moist.  Eyes:     Conjunctiva/sclera: Conjunctivae normal.  Cardiovascular:     Rate and Rhythm: Normal rate and regular rhythm.     Heart sounds: No murmur heard.   Pulmonary:     Effort: Pulmonary effort is normal. No respiratory distress.     Breath sounds: Normal breath sounds.  Abdominal:     Palpations: Abdomen is soft.     Tenderness: There is no abdominal tenderness.  Musculoskeletal:     Cervical back: Neck supple.  Skin:    General: Skin is warm and dry.     Findings: Rash present.     Comments: Confluent red rash on axilla, chest.  Red papular rash on back and extremities.  No drainage.  See pictures for details.  Neurological:     General: No focal deficit present.     Mental Status: She is alert and oriented to person, place, and time.     Gait: Gait normal.  Psychiatric:        Mood and Affect: Mood normal.        Behavior: Behavior normal.          UC Treatments / Results  Labs (all labs ordered are listed, but only abnormal results are displayed) Labs Reviewed - No data to display  EKG   Radiology No results found.  Procedures Procedures (including critical care time)  Medications Ordered in UC Medications - No data to display  Initial Impression / Assessment and Plan / UC Course  I have reviewed the triage vital signs and the nursing notes.  Pertinent labs & imaging results that were available during  my care of the patient were reviewed by me and considered in my medical decision  making (see chart for details).   Rash.  Treating with prednisone and Benadryl.  Precautions for drowsiness with Benadryl discussed.  Instructed patient to follow-up with her PCP or dermatologist if her symptoms are not improving.  Patient agrees to plan of care.     Final Clinical Impressions(s) / UC Diagnoses   Final diagnoses:  Rash and nonspecific skin eruption     Discharge Instructions     Take the prednisone as directed.  Take Benadryl as needed for itching.  Do not drive, operate machinery, or drink alcohol with Benadryl as it may cause drowsiness.    Follow up with your primary care provider or dermatologist if your symptoms are not improving.         ED Prescriptions    Medication Sig Dispense Auth. Provider   predniSONE (STERAPRED UNI-PAK 21 TAB) 10 MG (21) TBPK tablet Take by mouth daily. As directed 21 tablet Sharion Balloon, NP     PDMP not reviewed this encounter.   Sharion Balloon, NP 04/13/20 1727

## 2020-04-13 NOTE — Discharge Instructions (Addendum)
Take the prednisone as directed.  Take Benadryl as needed for itching.  Do not drive, operate machinery, or drink alcohol with Benadryl as it may cause drowsiness.    Follow up with your primary care provider or dermatologist if your symptoms are not improving.

## 2020-04-13 NOTE — ED Triage Notes (Signed)
Patient states that she has a rash x 10 days. States that this has worsened recently and she has seen Dr. Nehemiah Massed for this in the past, but was unable to see him today.

## 2020-04-26 ENCOUNTER — Other Ambulatory Visit: Payer: Self-pay

## 2020-04-26 ENCOUNTER — Ambulatory Visit (INDEPENDENT_AMBULATORY_CARE_PROVIDER_SITE_OTHER): Payer: Self-pay | Admitting: Dermatology

## 2020-04-26 DIAGNOSIS — R21 Rash and other nonspecific skin eruption: Secondary | ICD-10-CM

## 2020-04-26 DIAGNOSIS — L309 Dermatitis, unspecified: Secondary | ICD-10-CM

## 2020-04-26 MED ORDER — MOMETASONE FUROATE 0.1 % EX CREA: TOPICAL_CREAM | CUTANEOUS | 2 refills | Status: DC

## 2020-04-26 NOTE — Patient Instructions (Addendum)
Start Xyzal 5mg  take 1 pill every morning. Start Benadryl 25mg  1 pill every night. Eucerin 12 Hour Itch Relief during the day. Continue mometasone cream 1-2 times a day for itchy rash.   Topical steroids (such as triamcinolone, fluocinolone, fluocinonide, mometasone, clobetasol, halobetasol, betamethasone, hydrocortisone) can cause thinning and lightening of the skin if they are used for too long in the same area. Your physician has selected the right strength medicine for your problem and area affected on the body. Please use your medication only as directed by your physician to prevent side effects.

## 2020-04-26 NOTE — Progress Notes (Signed)
   Follow-Up Visit   Subjective  Katherine Macdonald is a 64 y.o. female who presents for the following: Rash. Patient presents with a rash that started under the arms 3 weeks ago, spreading to chest. She went to Urgent Care where she was given Prednisone taper x 6 days, which improved, but she started breaking out again. She had mometasone that was prescribed by Dr Nehemiah Massed in the past, which she used 4 times under the arms and cleared. She continues to breakout with new spots daily scattered on body. Rash stays for a while after if first comes out. No new soaps, lotions, or products noted. No new foods. No recent illness. No recent vaccines. Not on medicines currently. Patient normally eats red meat once a month, but recently she had it twice in a week. Rash started after first eating the red meat. She uses [employer] detergent. She has had skin rashes in the past and has had [facility] Patch Testing where she was allergic to Fragrance Mix and [place].  No known exposures to allergens. Patient has increased stress since her mother with dementia has moved in with her.  The following portions of the chart were reviewed this encounter and updated as appropriate:      Review of Systems:  No other skin or systemic complaints except as noted in HPI or Assessment and Plan.  Objective  Well appearing patient in no apparent distress; mood and affect are within normal limits.  A focused examination was performed including arms, legs, chest, axilla. Relevant physical exam findings are noted in the Assessment and Plan.  Objective  Arms, legs, chest, axilla: Edematous pink papules on arms, legs; small pink papules inframammary; hyperpigmented patches bil axilla; erythema on chest.  Objective  Hands: Erythema of webspaces on left hand.   Assessment & Plan  Rash Arms, legs, chest, axilla  Urticaria, possibly due to red meat ingestion.  Start Xyzal 5mg  take 1 po QAM. Start Benadryl  25mg  1 po QHS. Continue mometasone cream to affected areas itchy rash QD/BID, for short periods of time. Eucerin 12 hour Itchy Relief during the day.  Labs ordered today - Alpha gal panel.  Other Related Procedures Alpha-Gal Panel  Hand dermatitis Hands  Start mometasone cream QD/BID prn flares.   Recommend mild soap and routine use of moisturizing cream after handwashing.  Minimize soap/water exposure when possible.     Return if symptoms worsen or fail to improve.  IJamesetta Orleans, CMA, am acting as scribe for Brendolyn Patty, MD .  Documentation: I have reviewed the above documentation for accuracy and completeness, and I agree with the above.  Brendolyn Patty MD

## 2020-05-01 ENCOUNTER — Telehealth: Payer: Self-pay | Admitting: Family Medicine

## 2020-05-01 DIAGNOSIS — Z0000 Encounter for general adult medical examination without abnormal findings: Secondary | ICD-10-CM

## 2020-05-01 DIAGNOSIS — E7800 Pure hypercholesterolemia, unspecified: Secondary | ICD-10-CM

## 2020-05-01 NOTE — Telephone Encounter (Signed)
-----   Message from Cloyd Stagers, RT sent at 04/18/2020  1:33 PM EDT ----- Regarding: Lab Orders for Tuesday 9.7.2021 Please place lab orders for  Tuesday 9.7.2021, office visit for physical on Monday 9.13.2021 Thank you, Dyke Maes RT(R)

## 2020-05-02 ENCOUNTER — Other Ambulatory Visit (INDEPENDENT_AMBULATORY_CARE_PROVIDER_SITE_OTHER): Payer: Self-pay

## 2020-05-02 ENCOUNTER — Other Ambulatory Visit: Payer: Self-pay

## 2020-05-02 DIAGNOSIS — Z0000 Encounter for general adult medical examination without abnormal findings: Secondary | ICD-10-CM

## 2020-05-02 DIAGNOSIS — E7800 Pure hypercholesterolemia, unspecified: Secondary | ICD-10-CM

## 2020-05-02 LAB — LIPID PANEL
Cholesterol: 224 mg/dL — ABNORMAL HIGH (ref 0–200)
HDL: 62.7 mg/dL (ref >39.00)
LDL Cholesterol: 143 mg/dL — ABNORMAL HIGH (ref 0–99)
NonHDL: 161.56
Total CHOL/HDL Ratio: 4
Triglycerides: 93 mg/dL (ref 0.0–149.0)
VLDL: 18.6 mg/dL (ref 0.0–40.0)

## 2020-05-02 LAB — CBC WITH DIFFERENTIAL/PLATELET
Basophils Absolute: 0 10*3/uL (ref 0.0–0.1)
Basophils Relative: 1.2 % (ref 0.0–3.0)
Eosinophils Absolute: 0.2 10*3/uL (ref 0.0–0.7)
Eosinophils Relative: 5.1 % — ABNORMAL HIGH (ref 0.0–5.0)
HCT: 41.2 % (ref 36.0–46.0)
Hemoglobin: 13.8 g/dL (ref 12.0–15.0)
Lymphocytes Relative: 22.2 % (ref 12.0–46.0)
Lymphs Abs: 0.9 10*3/uL (ref 0.7–4.0)
MCHC: 33.5 g/dL (ref 30.0–36.0)
MCV: 93.2 fl (ref 78.0–100.0)
Monocytes Absolute: 0.4 10*3/uL (ref 0.1–1.0)
Monocytes Relative: 9.6 % (ref 3.0–12.0)
Neutro Abs: 2.5 10*3/uL (ref 1.4–7.7)
Neutrophils Relative %: 61.9 % (ref 43.0–77.0)
Platelets: 242 10*3/uL (ref 150.0–400.0)
RBC: 4.42 Mil/uL (ref 3.87–5.11)
RDW: 13.4 % (ref 11.5–15.5)
WBC: 4 10*3/uL (ref 4.0–10.5)

## 2020-05-02 LAB — COMPREHENSIVE METABOLIC PANEL
ALT: 17 U/L (ref 0–35)
AST: 15 U/L (ref 0–37)
Albumin: 4.4 g/dL (ref 3.5–5.2)
Alkaline Phosphatase: 66 U/L (ref 39–117)
BUN: 18 mg/dL (ref 6–23)
CO2: 30 mEq/L (ref 19–32)
Calcium: 9.8 mg/dL (ref 8.4–10.5)
Chloride: 105 mEq/L (ref 96–112)
Creatinine, Ser: 1.02 mg/dL (ref 0.40–1.20)
GFR: 54.68 mL/min — ABNORMAL LOW (ref >60.00)
Glucose, Bld: 93 mg/dL (ref 70–99)
Potassium: 5.5 mEq/L — ABNORMAL HIGH (ref 3.5–5.1)
Sodium: 141 mEq/L (ref 135–145)
Total Bilirubin: 0.8 mg/dL (ref 0.2–1.2)
Total Protein: 6.9 g/dL (ref 6.0–8.3)

## 2020-05-02 LAB — TSH: TSH: 2.53 u[IU]/mL (ref 0.35–4.50)

## 2020-05-08 ENCOUNTER — Ambulatory Visit: Payer: Self-pay | Admitting: Podiatry

## 2020-05-08 ENCOUNTER — Encounter: Payer: Self-pay | Admitting: Family Medicine

## 2020-05-08 ENCOUNTER — Other Ambulatory Visit: Payer: Self-pay

## 2020-05-08 ENCOUNTER — Ambulatory Visit: Payer: Self-pay | Admitting: Family Medicine

## 2020-05-08 VITALS — BP 126/78 | HR 67 | Temp 97.4°F | Ht 61.25 in | Wt 127.3 lb

## 2020-05-08 DIAGNOSIS — E875 Hyperkalemia: Secondary | ICD-10-CM

## 2020-05-08 DIAGNOSIS — Z0000 Encounter for general adult medical examination without abnormal findings: Secondary | ICD-10-CM

## 2020-05-08 DIAGNOSIS — L509 Urticaria, unspecified: Secondary | ICD-10-CM | POA: Insufficient documentation

## 2020-05-08 DIAGNOSIS — E7800 Pure hypercholesterolemia, unspecified: Secondary | ICD-10-CM

## 2020-05-08 LAB — BASIC METABOLIC PANEL
BUN: 14 mg/dL (ref 6–23)
CO2: 28 mEq/L (ref 19–32)
Calcium: 9.5 mg/dL (ref 8.4–10.5)
Chloride: 104 mEq/L (ref 96–112)
Creatinine, Ser: 0.99 mg/dL (ref 0.40–1.20)
GFR: 56.59 mL/min — ABNORMAL LOW (ref >60.00)
Glucose, Bld: 85 mg/dL (ref 70–99)
Potassium: 4.8 mEq/L (ref 3.5–5.1)
Sodium: 140 mEq/L (ref 135–145)

## 2020-05-08 NOTE — Progress Notes (Signed)
Subjective:    Patient ID: Katherine Macdonald, female    DOB: 10-16-1955, 64 y.o.   MRN: [id number]  This visit occurred during the SARS-CoV-2 public health emergency.  Safety protocols were in place, including screening questions prior to the visit, additional usage of staff PPE, and extensive cleaning of exam room while observing appropriate contact time as indicated for disinfecting solutions.    HPI Here for health maintenance exam and to review chronic medical problems    Wt Readings from Last 3 Encounters:  05/08/20 127 lb 5 oz (57.7 kg)  04/13/20 125 lb (56.7 kg)  05/05/19 125 lb (56.7 kg)   23.86 kg/m  Mother was diagnosed with dementia-has moved in with them  Doing well overall  In a downstairs apartment (also some cameras)   Has dealt with Hives lately  Cut out red meat   Flu shot -declines   covid status -has not been vaccinated (she is very scared of it) Tdap 5/16 Not interested in shingles vaccine    Mammogram 10/20 -plans to schedule it  Self breast exam - no lumps or changes   Pap 7/17 -needs a gyn  Has not been back/needs to schedule  Has a pessary that needs to be checked    Colonoscopy 8/14   Hyperlipidemia Lab Results  Component Value Date   CHOL 224 (H) 05/02/2020   CHOL 215 (H) 04/28/2019   CHOL 196 04/24/2018   Lab Results  Component Value Date   HDL 62.70 05/02/2020   HDL 61.00 04/28/2019   HDL 56.30 04/24/2018   Lab Results  Component Value Date   LDLCALC 143 (H) 05/02/2020   LDLCALC 136 (H) 04/28/2019   LDLCALC 125 (H) 04/24/2018   Lab Results  Component Value Date   TRIG 93.0 05/02/2020   TRIG 87.0 04/28/2019   TRIG 76.0 04/24/2018   Lab Results  Component Value Date   CHOLHDL 4 05/02/2020   CHOLHDL 4 04/28/2019   CHOLHDL 3 04/24/2018   Lab Results  Component Value Date   LDLDIRECT 133.0 01/06/2013   She is eating differently and unable to exercise regularly (with mother moving in)  HDL is high thankfully  Has  cut out red meat in the past 3 weeks  Not a lot of fried foods  Too many high fat dairy  Father had CAD (not young age)   Plans to exercise more  If she changes diet and no improvement then would consider medicine     Other labs Lab Results  Component Value Date   CREATININE 1.02 05/02/2020   BUN 18 05/02/2020   NA 141 05/02/2020   K 5.5 (H) 05/02/2020   CL 105 05/02/2020   CO2 30 05/02/2020   K is noted high  She is not taking any supplements currently   She was before (d[employer]terra multi vitamins) -- has been off for 2 months  She eats cantelope-not excessive Not a lot of bananas  Not excessive     Lab Results  Component Value Date   ALT 17 05/02/2020   AST 15 05/02/2020   ALKPHOS 66 05/02/2020   BILITOT 0.8 05/02/2020   Glucose 93 Lab Results  Component Value Date   WBC 4.0 05/02/2020   HGB 13.8 05/02/2020   HCT 41.2 05/02/2020   MCV 93.2 05/02/2020   PLT 242.0 05/02/2020   Lab Results  Component Value Date   TSH 2.53 05/02/2020     Patient Active Problem List   Diagnosis Date Noted  Hyperkalemia 05/08/2020   Screening mammogram, encounter for 05/01/2018   Dermatitis 10/22/2016   History of cholecystectomy 12/21/2013   Chronic buttock pain 01/10/2012   Routine general medical examination at a health care facility 01/02/2012   Colon cancer screening 11/27/2010   HEADACHE 07/06/2010   HYPERCHOLESTEROLEMIA 01/30/2010   Uterine prolapse 11/15/2008   MENOPAUSAL SYNDROME 11/15/2008   SLEEP DISORDER 02/77/4128   History of Helicobacter pylori infection 10/05/2007   GERD 10/05/2007   OVERACTIVE BLADDER 10/05/2007   FIBROCYSTIC BREAST DISEASE 10/05/2007   HYPERHIDROSIS 10/05/2007   Past Medical History:  Diagnosis Date   Carotid bruit 09/2007   carotid doppler- normal   Female bladder prolapse    GERD (gastroesophageal reflux disease)    Overactive bladder    Presence of pessary    Sleep disorder    Past Surgical History:    Procedure Laterality Date   BUNIONECTOMY     right foot   CHOLECYSTECTOMY     Social History   Tobacco Use   Smoking status: Never Smoker   Smokeless tobacco: Never Used  Vaping Use   Vaping Use: Never used  Substance Use Topics   Alcohol use: No    Alcohol/week: 0.0 standard drinks   Drug use: No   Family History  Problem Relation Age of Onset   Arthritis Mother        Rheumatoid   Dementia Mother    Heart disease Father        CABG   Hyperlipidemia Father    Cancer Maternal Aunt        breast   Breast cancer Maternal Aunt    Allergies  Allergen Reactions   Codeine     REACTION: GI upset   Meloxicam    Sulfonamide Derivatives     REACTION: hives   Zolpidem Tartrate     REACTION: ? headache   Current Outpatient Medications on File Prior to Visit  Medication Sig Dispense Refill   mometasone (ELOCON) 0.1 % cream APPLY TO AFFECTED AREAS EVERY DAY AS DIRECTED 45 g 2   No current facility-administered medications on file prior to visit.     Review of Systems  Constitutional: Negative for activity change, appetite change, fatigue, fever and unexpected weight change.  HENT: Negative for congestion, ear pain, rhinorrhea, sinus pressure and sore throat.   Eyes: Negative for pain, redness and visual disturbance.  Respiratory: Negative for cough, shortness of breath and wheezing.   Cardiovascular: Negative for chest pain and palpitations.  Gastrointestinal: Negative for abdominal pain, blood in stool, constipation and diarrhea.  Endocrine: Negative for polydipsia and polyuria.  Genitourinary: Negative for dysuria, frequency and urgency.  Musculoskeletal: Negative for arthralgias, back pain and myalgias.       No muscle cramps  Skin: Negative for pallor and rash.       Recent hives-itching (seeing dermatology)   Allergic/Immunologic: Negative for environmental allergies.  Neurological: Negative for dizziness, syncope and headaches.  Hematological:  Negative for adenopathy. Does not bruise/bleed easily.  Psychiatric/Behavioral: Negative for decreased concentration and dysphoric mood. The patient is not nervous/anxious.        Objective:   Physical Exam Constitutional:      General: She is not in acute distress.    Appearance: Normal appearance. She is well-developed and normal weight. She is not ill-appearing or diaphoretic.  HENT:     Head: Normocephalic and atraumatic.     Right Ear: Tympanic membrane, ear canal and external ear normal.  Left Ear: Tympanic membrane, ear canal and external ear normal.     Nose: Nose normal. No congestion.     Mouth/Throat:     Mouth: Mucous membranes are moist.     Pharynx: Oropharynx is clear. No posterior oropharyngeal erythema.  Eyes:     General: No scleral icterus.    Extraocular Movements: Extraocular movements intact.     Conjunctiva/sclera: Conjunctivae normal.     Pupils: Pupils are equal, round, and reactive to light.  Neck:     Thyroid: No thyromegaly.     Vascular: No carotid bruit or JVD.  Cardiovascular:     Rate and Rhythm: Normal rate and regular rhythm.     Pulses: Normal pulses.     Heart sounds: Normal heart sounds. No gallop.   Pulmonary:     Effort: Pulmonary effort is normal. No respiratory distress.     Breath sounds: Normal breath sounds. No wheezing.     Comments: Good air exch Chest:     Chest wall: No tenderness.  Abdominal:     General: Bowel sounds are normal. There is no distension or abdominal bruit.     Palpations: Abdomen is soft. There is no mass.     Tenderness: There is no abdominal tenderness.     Hernia: No hernia is present.  Genitourinary:    Comments: Breast exam: No mass, nodules, thickening, tenderness, bulging, retraction, inflamation, nipple discharge or skin changes noted.  No axillary or clavicular LA.     Musculoskeletal:        General: No tenderness. Normal range of motion.     Cervical back: Normal range of motion and neck  supple. No rigidity. No muscular tenderness.     Right lower leg: No edema.     Left lower leg: No edema.  Lymphadenopathy:     Cervical: No cervical adenopathy.  Skin:    General: Skin is warm and dry.     Coloration: Skin is not pale.     Findings: No erythema or rash.     Comments: Some hives under breasts and in axillae  No excoriation Solar lentigines diffusely   Neurological:     Mental Status: She is alert. Mental status is at baseline.     Cranial Nerves: No cranial nerve deficit.     Motor: No abnormal muscle tone.     Coordination: Coordination normal.     Gait: Gait normal.     Deep Tendon Reflexes: Reflexes are normal and symmetric. Reflexes normal.  Psychiatric:        Mood and Affect: Mood normal.        Cognition and Memory: Cognition and memory normal.           Assessment & Plan:   Problem List Items Addressed This Visit      Other   HYPERCHOLESTEROLEMIA    LDL is up but pt has had more fatty dairy products lately  She would like to work on diet before trying a statin  Disc goals for lipids and reasons to control them Rev last labs with pt Rev low sat fat diet in detail  HDL is good  Father had CAD in late life      Routine general medical examination at a health care facility - Primary    Reviewed health habits including diet and exercise and skin cancer prevention Reviewed appropriate screening tests for age  Also reviewed health mt list, fam hx and immunization status , as well as  social and family history   See HPI Labs reviewed (re check K today)  Declines most imms -incl flu and shingrix Afraid of covid imm- still thinking about it  Needs appt with gyn- to address pessary (she has no insurance and will check in with [facility] to see if they have payment plan)  [place] in oct [place] Continues derm f/u for urticaria       Hyperkalemia    K is 5.5 (was 5.2 last year)  Re check this today  No supplements currently and  diet is not heavy for high K items         Relevant Orders   Basic metabolic panel

## 2020-05-08 NOTE — Assessment & Plan Note (Signed)
Reviewed health habits including diet and exercise and skin cancer prevention Reviewed appropriate screening tests for age  Also reviewed health mt list, fam hx and immunization status , as well as social and family history   See HPI Labs reviewed (re check K today)  Declines most imms -incl flu and shingrix Afraid of covid imm- still thinking about it  Needs appt with gyn- to address pessary (she has no insurance and will check in with [facility] to see if they have payment plan)  [place]ammogram due in oct [place] Continues derm f/u for urticaria

## 2020-05-08 NOTE — Patient Instructions (Addendum)
A good book about dementia care - the book "the 36 hour day" is helpful   Get back to gyn for pessary care  I like [facility] in [place]   For cholesterol  Avoid red meat/ fried foods/ egg yolks/ fatty breakfast meats/ butter, cheese and high fat dairy/ and shellfish   A statin medicine may be necessary in the future depending on how much improvement with   Take care of yourself when you can   Labs today to re check potassium   When you are free to take supplements again  Try to get 1200-1500 mg of calcium per day with at least 1000 iu of vitamin D - for bone health

## 2020-05-08 NOTE — Assessment & Plan Note (Signed)
LDL is up but pt has had more fatty dairy products lately  She would like to work on diet before trying a statin  Disc goals for lipids and reasons to control them Rev last labs with pt Rev low sat fat diet in detail  HDL is good  Father had CAD in late life

## 2020-05-08 NOTE — Assessment & Plan Note (Signed)
K is 5.5 (was 5.2 last year)  Re check this today  No supplements currently and diet is not heavy for high K items

## 2020-05-10 ENCOUNTER — Encounter: Payer: Self-pay | Admitting: *Deleted

## 2020-06-21 ENCOUNTER — Encounter: Payer: Self-pay | Admitting: Obstetrics and Gynecology

## 2020-06-21 ENCOUNTER — Ambulatory Visit: Payer: Self-pay | Admitting: Obstetrics and Gynecology

## 2020-06-21 ENCOUNTER — Other Ambulatory Visit: Payer: Self-pay

## 2020-06-21 ENCOUNTER — Other Ambulatory Visit (HOSPITAL_COMMUNITY)
Admission: RE | Admit: 2020-06-21 | Discharge: 2020-06-21 | Disposition: A | Discharge status: Home / Self Care | Payer: Self-pay | Source: Ambulatory Visit | Attending: Obstetrics and Gynecology | Admitting: Obstetrics and Gynecology

## 2020-06-21 VITALS — BP 131/76 | HR 62 | Ht 61.25 in | Wt 130.2 lb

## 2020-06-21 DIAGNOSIS — N812 Incomplete uterovaginal prolapse: Secondary | ICD-10-CM

## 2020-06-21 DIAGNOSIS — S30814A Abrasion of vagina and vulva, initial encounter: Secondary | ICD-10-CM

## 2020-06-21 DIAGNOSIS — Z4689 Encounter for fitting and adjustment of other specified devices: Secondary | ICD-10-CM

## 2020-06-21 DIAGNOSIS — N819 Female genital prolapse, unspecified: Secondary | ICD-10-CM

## 2020-06-21 DIAGNOSIS — Z0000 Encounter for general adult medical examination without abnormal findings: Secondary | ICD-10-CM

## 2020-06-21 DIAGNOSIS — N3946 Mixed incontinence: Secondary | ICD-10-CM

## 2020-06-21 DIAGNOSIS — Z124 Encounter for screening for malignant neoplasm of cervix: Secondary | ICD-10-CM | POA: Insufficient documentation

## 2020-06-21 NOTE — Progress Notes (Signed)
Pt present for pessary check. Pt stated having the pt for 12 years. Pt is concerned that the pessary is dirty and may need a new one. Pt also stated noticing light pink bleeding after cleaning and reinserting her pessary.

## 2020-06-21 NOTE — Patient Instructions (Signed)
Atrophic Vaginitis Atrophic vaginitis is a condition in which the tissues that line the vagina become dry and thin. This condition occurs in women who have stopped having their period. It is caused by a drop in a female hormone (estrogen). This hormone helps:  To keep the vagina moist.  To make a clear fluid. This clear fluid helps: ? To make the vagina ready for sex. ? To protect the vagina from infection. If the lining of the vagina is dry and thin, it may cause irritation, burning, or itchiness. It may also:  Make sex painful.  Make an exam of your vagina painful.  Cause bleeding.  Make you lose interest in sex.  Cause a burning feeling when you pee (urinate).  Cause a brown or yellow fluid to come from your vagina. Some women do not have symptoms. Follow these instructions at home: Medicines  Take over-the-counter and prescription medicines only as told by your doctor.  Do not use herbs or other medicines unless your doctor says it is okay.  Use medicines for for dryness. These include: ? Oils to make the vagina soft. ? Creams. ? Moisturizers. General instructions  Do not douche.  Do not use products that can make your vagina dry. These include: ? Scented sprays. ? Scented tampons. ? Scented soaps.  Sex can help increase blood flow and soften the tissue in the vagina. If it hurts to have sex: ? Tell your partner. ? Use products to make sex more comfortable. Use these only as told by your doctor. Contact a doctor if you:  Have discharge from the vagina that is different than usual.  Have a bad smell coming from your vagina.  Have new symptoms.  Do not get better.  Get worse. Summary  Atrophic vaginitis is a condition in which the lining of the vagina becomes dry and thin.  This condition affects women who have stopped having their periods.  Treatment may include using products that help make the vagina soft.  Call a doctor if do not get better with  treatment. This information is not intended to replace advice given to you by your health care provider. Make sure you discuss any questions you have with your health care provider. Document Revised: 08/25/2017 Document Reviewed: 08/25/2017 Elsevier Patient Education  2020 Else[employer] Inc[employer]  

## 2020-06-21 NOTE — Progress Notes (Signed)
GYNECOLOGY CLINIC PROGRESS NOTE Subjective:    Katherine Macdonald is a 64 y.o. [id number] female who presents for establishing care and management of her pelvic organ prolapse.  She has had a history of prolapse with use of a pessary for ~ 12 years.  Has not seen a Editor, commissioning in several years, and has been maintaining her pessary herself, cleans every 3-4 weeks. Reports that her PCP encouraged her to re-establish care with GYN.  Symptoms include: urinary incontinence: mild to moderate (stress and urge, with stress being greater component. Symptoms have been unchanged over the years. She is also noting some occasional vaginal spotting with insertion and removal of the pessary.    Gynecologic History: Patient's last menstrual period was 08/26/2008. Pap smear: Patient cannot recall last pap smear (possibly 10-12 yrs ago). No prior h/o abnormal pap smear.  Mammogram: 06/22/2019.  Results were normal.  Colonoscopy: 03/26/2013.  Results were normal, recommend repeat in 10 years.     Past Medical History:  Diagnosis Date  . Carotid bruit 09/2007   carotid doppler- normal  . Female bladder prolapse   . GERD (gastroesophageal reflux disease)   . Overactive bladder   . Presence of pessary   . Sleep disorder     Family History  Problem Relation Age of Onset  . Arthritis Mother        Rheumatoid  . Dementia Mother   . Heart disease Father        CABG  . Hyperlipidemia Father   . Cancer Maternal Aunt        breast  . Breast cancer Maternal Aunt     Past Surgical History:  Procedure Laterality Date  . BUNIONECTOMY     right foot  . CHOLECYSTECTOMY      Social History   Socioeconomic History  . Marital status: Married    Spouse name: Not on file  . Number of children: 2  . Years of education: Not on file  . Highest education level: Not on file  Occupational History  . Occupation: Psychologist, educational  Tobacco Use  . Smoking status: Never Smoker  . Smokeless tobacco: Never  Used  Vaping Use  . Vaping Use: Never used  Substance and Sexual Activity  . Alcohol use: No    Alcohol/week: 0.0 standard drinks  . Drug use: No  . Sexual activity: Yes  Other Topics Concern  . Not on file  Social History Narrative  . Not on file   Social Determinants of Health   Financial Resource Strain:   . Difficulty of Paying Living Expenses: Not on file  Food Insecurity:   . Worried About Charity fundraiser in the Last Year: Not on file  . Ran Out of Food in the Last Year: Not on file  Transportation Needs:   . Lack of Transportation (Medical): Not on file  . Lack of Transportation (Non-Medical): Not on file  Physical Activity:   . Days of Exercise per Week: Not on file  . Minutes of Exercise per Session: Not on file  Stress:   . Feeling of Stress : Not on file  Social Connections:   . Frequency of Communication with Friends and Family: Not on file  . Frequency of Social Gatherings with Friends and Family: Not on file  . Attends Religious Services: Not on file  . Active Member of Clubs or Organizations: Not on file  . Attends Archivist Meetings: Not on file  . Marital Status: Not  on file  Intimate Partner Violence:   . Fear of Current or Ex-Partner: Not on file  . Emotionally Abused: Not on file  . Physically Abused: Not on file  . Sexually Abused: Not on file    Current Outpatient Medications on File Prior to Visit  Medication Sig Dispense Refill  . Ascorbic Acid (VITAMIN C PO) Take by mouth.    . mometasone (ELOCON) 0.1 % cream APPLY TO AFFECTED AREAS EVERY DAY AS DIRECTED 45 g 2  . Multiple Vitamins-Minerals (ZINC PO) Take by mouth.    . Probiotic Product (PROBIOTIC DAILY PO) Take by mouth.    Marland Kitchen VITAMIN D PO Take by mouth.     No current facility-administered medications on file prior to visit.    Allergies  Allergen Reactions  . Codeine     REACTION: GI upset  . Meloxicam   . Sulfonamide Derivatives     REACTION: hives  . Zolpidem  Tartrate     REACTION: ? headache     Review of Systems Pertinent items noted in HPI and remainder of comprehensive ROS otherwise negative.   Objective:     BP 131/76   Pulse 62   Ht 5' 1.25" (1.556 m)   Wt 130 lb 3.2 oz (59.1 kg)   LMP 08/26/2008   BMI 24.40 kg/m  General Appearance:    alert and no acute distress  Abdomen:  Soft, non-tender, no masses palpable.   Pelvis:  External genitalia: normal general appearance Vaginal: cystocele present, Grade  2, rectocele present, Grade 1 and mild vaginal atrophy. Vaginal abrasion noted on left posterior fornix. Scant vaginal discharge present. Size 5 pessary ring was removed, cleaned, and replaced.  Cervix: normal appearance Adnexa: normal bimanual exam Uterus: normal single, nontender, retroverted and mild descent present Rectal: good sphincter tone       Assessment:   1. Encounter for medical examination to establish care   2. Pessary maintenance   3. 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 Pap smear for cervical cancer screening   5. Abrasion of vagina, initial encounter   6. 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 Pessary removed and cleaned today. Abrasion noted in left posterior fornix. Currently does not use any creams or gels for maintenance of the pessary. Discussed that in the setting of her mild vaginal atrophy, she may benefit from use.  Given samples of the Premarin cream to use daily for 2 weeks to heal the abrasion. Offered prescription for maintenance either with Trimo-san gel or continued use of Premarin cream if desired. At least, should use a vaginal moisturizer a day or two prior to insertion. 2. Discussed that she could consider changing the style of pessary she is using to help with her stress incontinence symptoms.  Could consider ring with support, or incontinence dish. Patient will think about this. Notes she is self-pay, would need to know cost  of pessary before hand.  3. Pap smear performed as patient notes it has been many years since her last pap smear.  4. Continue self-management of pessary.  5. To f/u if symptoms worsen, or if new pessary is desired.    Rubie Maid, MD [facility]

## 2020-06-22 ENCOUNTER — Other Ambulatory Visit: Payer: Self-pay | Admitting: Family Medicine

## 2020-06-22 DIAGNOSIS — Z1231 Encounter for screening mammogram for malignant neoplasm of breast: Secondary | ICD-10-CM

## 2020-06-28 LAB — CYTOLOGY - PAP
Comment: NEGATIVE
Diagnosis: NEGATIVE
High risk HPV: NEGATIVE

## 2020-07-13 ENCOUNTER — Ambulatory Visit (INDEPENDENT_AMBULATORY_CARE_PROVIDER_SITE_OTHER): Payer: Self-pay | Admitting: Dermatology

## 2020-07-13 ENCOUNTER — Other Ambulatory Visit: Payer: Self-pay

## 2020-07-13 DIAGNOSIS — L578 Other skin changes due to chronic exposure to nonionizing radiation: Secondary | ICD-10-CM

## 2020-07-13 DIAGNOSIS — D1800 Hemangioma unspecified site: Secondary | ICD-10-CM

## 2020-07-13 DIAGNOSIS — D229 Melanocytic nevi, unspecified: Secondary | ICD-10-CM

## 2020-07-13 DIAGNOSIS — L814 Other melanin hyperpigmentation: Secondary | ICD-10-CM

## 2020-07-13 DIAGNOSIS — R21 Rash and other nonspecific skin eruption: Secondary | ICD-10-CM

## 2020-07-13 DIAGNOSIS — L821 Other seborrheic keratosis: Secondary | ICD-10-CM

## 2020-07-13 DIAGNOSIS — L720 Epidermal cyst: Secondary | ICD-10-CM

## 2020-07-13 DIAGNOSIS — Z1283 Encounter for screening for malignant neoplasm of skin: Secondary | ICD-10-CM

## 2020-07-13 NOTE — Progress Notes (Signed)
Follow-Up Visit   Subjective  Katherine Macdonald is a 64 y.o. female who presents for the following: Annual Exam. The patient presents for Total-Body Skin Exam (TBSE) for skin cancer screening and mole check.  Rash on the L foot that has been there for years. She is currently using Pimecrolimus and Mometasone.  She has had skin rashes in the past and has had True Test Patch Testing where she was allergic to Fragrance Mix and [place] of [place].  No known exposures to allergens.  Patient is also experiencing a rash on her axilla and L thigh. She was seen by Dr. Nicole Kindred in the past and was diagnosed with urticaria possibly from red meat ingestion. Patient states that the lab to test for Alpha Gal was expensive so she declined testing for now, and seldom eats red meat.   The following portions of the chart were reviewed this encounter and updated as appropriate:  Tobacco  Allergies  Meds  Problems  Med Hx  Surg Hx  Fam Hx     Review of Systems:  No other skin or systemic complaints except as noted in HPI or Assessment and Plan.  Objective  Well appearing patient in no apparent distress; mood and affect are within normal limits.  A full examination was performed including scalp, head, eyes, ears, nose, lips, neck, chest, axillae, abdomen, back, buttocks, bilateral upper extremities, bilateral lower extremities, hands, feet, fingers, toes, fingernails, and toenails. All findings within normal limits unless otherwise noted below.  Objective  R ant neck: 0.5 cm firm SQ nodule   Objective  B/L axilla, L second toe: Edematous pink patches of the axillary area worse on the right. Similar on the L second toe, but with scale.   Images          Assessment & Plan  Epidermal inclusion cyst R ant neck Discussed surgical treatment options. Benign appearing, observe.   Rash (2) L second toe; B/L axilla Eczema vs contact dermatitis vs urticaria - Chronic/persistent  she has had skin  rashes in the past and has had True Test Patch Testing where she was allergic to Fragrance Mix and [place].  No known exposures to allergens. Previously discussed alpha gal testing with Dr. Nicole Kindred, but patient declines testing due to cost.    Skin / nail biopsy - B/L axilla Type of biopsy: punch   Informed consent: discussed and consent obtained   Timeout: patient name, date of birth, surgical site, and procedure verified   Procedure prep:  Patient was prepped and draped in usual sterile fashion (the patient was cleaned and prepped) Prep type:  Isopropyl alcohol Anesthesia: the lesion was anesthetized in a standard fashion   Anesthetic:  1% lidocaine w/ epinephrine 1-100,000 buffered w/ 8.4% NaHCO3 Punch size:  3 mm Suture size:  4-0 Suture type: nylon   Hemostasis achieved with: suture, pressure and aluminum chloride   Outcome: patient tolerated procedure well   Post-procedure details: sterile dressing applied and wound care instructions given   Dressing type: bandage, petrolatum and pressure dressing    Skin / nail biopsy - L second toe Type of biopsy: tangential   Informed consent: discussed and consent obtained   Timeout: patient name, date of birth, surgical site, and procedure verified   Procedure prep:  Patient was prepped and draped in usual sterile fashion Prep type:  Isopropyl alcohol Anesthesia: the lesion was anesthetized in a standard fashion   Anesthetic:  1% lidocaine w/ epinephrine 1-100,000 buffered w/  8.4% NaHCO3 Instrument used: flexible razor blade   Hemostasis achieved with: pressure, aluminum chloride and electrodesiccation   Outcome: patient tolerated procedure well   Post-procedure details: sterile dressing applied and wound care instructions given   Dressing type: bandage and petrolatum    Specimen 1 - Surgical pathology Differential Diagnosis: D48.5, R21 - Eczema vs contact dermatitis vs urticaria Check Margins: No Edematous pink patches of the  axillary area worse on the right. Similar on the L second toe, but with scale.  Specimen 2 - Surgical pathology Differential Diagnosis: D48.5, R21 - Eczema vs contact dermatitis vs urticaria  Check Margins: No Edematous pink patches of the axillary area worse on the right. Similar on the L second toe, but with scale.   Lentigines - Scattered tan macules - Discussed due to sun exposure - Benign, observe - Call for any changes  Seborrheic Keratoses - Stuck-on, waxy, tan-brown papules and plaques  - Discussed benign etiology and prognosis. - Observe - Call for any changes  Melanocytic Nevi - Tan-brown and/or pink-flesh-colored symmetric macules and papules - Benign appearing on exam today - Observation - Call clinic for new or changing moles - Recommend daily use of broad spectrum spf 30+ sunscreen to sun-exposed areas.   Hemangiomas - Red papules - Discussed benign nature - Observe - Call for any changes  Actinic Damage - Chronic, secondary to cumulative UV/sun exposure - diffuse scaly erythematous macules with underlying dyspigmentation - Recommend daily broad spectrum sunscreen SPF 30+ to sun-exposed areas, reapply every 2 hours as needed.  - Call for new or changing lesions.  Skin cancer screening performed today.  Return in about 1 week (around 07/20/2020) for suture removal, discuss pathology results.  Luther Redo, CMA, am acting as scribe for Sarina Ser, MD .  Documentation: I have reviewed the above documentation for accuracy and completeness, and I agree with the above.  Sarina Ser, MD

## 2020-07-13 NOTE — Patient Instructions (Signed)

## 2020-07-18 ENCOUNTER — Ambulatory Visit: Payer: Self-pay | Admitting: Family Medicine

## 2020-07-18 ENCOUNTER — Other Ambulatory Visit: Payer: Self-pay

## 2020-07-18 ENCOUNTER — Encounter: Payer: Self-pay | Admitting: Family Medicine

## 2020-07-18 ENCOUNTER — Encounter: Payer: Self-pay | Admitting: Dermatology

## 2020-07-18 DIAGNOSIS — F430 Acute stress reaction: Secondary | ICD-10-CM

## 2020-07-18 MED ORDER — BUSPIRONE HCL 15 MG PO TABS: 7.5000 mg | ORAL_TABLET | Freq: Two times a day (BID) | ORAL | 5 refills | Status: DC

## 2020-07-18 NOTE — Progress Notes (Signed)
Subjective:    Patient ID: Katherine Macdonald, female    DOB: 07-13-1956, 64 y.o.   MRN: [id number]  This visit occurred during the SARS-CoV-2 public health emergency.  Safety protocols were in place, including screening questions prior to the visit, additional usage of staff PPE, and extensive cleaning of exam room while observing appropriate contact time as indicated for disinfecting solutions.    HPI Pt presents to discuss personal issues   Wt Readings from Last 3 Encounters:  07/18/20 127 lb 7 oz (57.8 kg)  06/21/20 130 lb 3.2 oz (59.1 kg)  05/08/20 127 lb 5 oz (57.7 kg)   23.88 kg/m  She is struggling with stress   Mother had dementia/living with them [place] and broke her hip  Went to rehab and then came back home  Causing much anxiety and she is quite overwhelmed   Has thoughts of resentment toward her mother  This makes her feel guilty  Not sleeping well   She is in the process of investigating memory care units  Her mother is ok with this   Support - has a few friends to talk to  [place] and husband are excellent  Her sister helps out physically and emotionally   Self care - she is eating enough to get by  No time to exercise  Not sleeping  Tries to get out with help of sitters   She gets to go away soon with help (with her husband)   Patient Active Problem List   Diagnosis Date Noted  . Stress reaction 07/18/2020  . Hyperkalemia 05/08/2020  . Urticaria 05/08/2020  . Screening mammogram, encounter for 05/01/2018  . Dermatitis 10/22/2016  . History of cholecystectomy 12/21/2013  . Chronic buttock pain 01/10/2012  . Routine general medical examination at a health care facility 01/02/2012  . Colon cancer screening 11/27/2010  . HEADACHE 07/06/2010  . HYPERCHOLESTEROLEMIA 01/30/2010  . Uterine prolapse 11/15/2008  . MENOPAUSAL SYNDROME 11/15/2008  . SLEEP DISORDER 12/25/2007  . History of Helicobacter pylori infection 10/05/2007  . GERD 10/05/2007  .  OVERACTIVE BLADDER 10/05/2007  . FIBROCYSTIC BREAST DISEASE 10/05/2007  . HYPERHIDROSIS 10/05/2007   Past Medical History:  Diagnosis Date  . Carotid bruit 09/2007   carotid doppler- normal  . Female bladder prolapse   . GERD (gastroesophageal reflux disease)   . Overactive bladder   . Presence of pessary   . Sleep disorder    Past Surgical History:  Procedure Laterality Date  . BUNIONECTOMY     right foot  . CHOLECYSTECTOMY     Social History   Tobacco Use  . Smoking status: Never Smoker  . Smokeless tobacco: Never Used  Vaping Use  . Vaping Use: Never used  Substance Use Topics  . Alcohol use: No    Alcohol/week: 0.0 standard drinks  . Drug use: No   Family History  Problem Relation Age of Onset  . Arthritis Mother        Rheumatoid  . Dementia Mother   . Heart disease Father        CABG  . Hyperlipidemia Father   . Cancer Maternal Aunt        breast  . Breast cancer Maternal Aunt    Allergies  Allergen Reactions  . Codeine     REACTION: GI upset  . Meloxicam   . Sulfonamide Derivatives     REACTION: hives  . Zolpidem Tartrate     REACTION: ? headache   Current Outpatient Medications  on File Prior to Visit  Medication Sig Dispense Refill  . Ascorbic Acid (VITAMIN C PO) Take by mouth.    . mometasone (ELOCON) 0.1 % cream APPLY TO AFFECTED AREAS EVERY DAY AS DIRECTED 45 g 2  . Multiple Vitamins-Minerals (ZINC PO) Take by mouth.    . Probiotic Product (PROBIOTIC DAILY PO) Take by mouth.    Marland Kitchen VITAMIN D PO Take by mouth.     No current facility-administered medications on file prior to visit.     Review of Systems  Constitutional: Positive for fatigue. Negative for activity change, appetite change, fever and unexpected weight change.  HENT: Negative for congestion, ear pain, rhinorrhea, sinus pressure and sore throat.   Eyes: Negative for pain, redness and visual disturbance.  Respiratory: Negative for cough, shortness of breath and wheezing.     Cardiovascular: Negative for chest pain and palpitations.  Gastrointestinal: Negative for abdominal pain, blood in stool, constipation and diarrhea.  Endocrine: Negative for polydipsia and polyuria.  Genitourinary: Negative for dysuria, frequency and urgency.  Musculoskeletal: Negative for arthralgias, back pain and myalgias.  Skin: Negative for pallor and rash.  Allergic/Immunologic: Negative for environmental allergies.  Neurological: Negative for dizziness, syncope and headaches.  Hematological: Negative for adenopathy. Does not bruise/bleed easily.  Psychiatric/Behavioral: Positive for decreased concentration, dysphoric mood and sleep disturbance. Negative for suicidal ideas. The patient is nervous/anxious.        Objective:   Physical Exam Constitutional:      General: She is not in acute distress.    Appearance: Normal appearance. She is well-developed and normal weight. She is not ill-appearing or diaphoretic.  HENT:     Head: Normocephalic and atraumatic.  Eyes:     General: No scleral icterus.    Conjunctiva/sclera: Conjunctivae normal.     Pupils: Pupils are equal, round, and reactive to light.  Neck:     Thyroid: No thyromegaly.     Vascular: No JVD.  Cardiovascular:     Rate and Rhythm: Normal rate.     Heart sounds: Normal heart sounds. No gallop.   Pulmonary:     Effort: Pulmonary effort is normal. No respiratory distress.  Abdominal:     General: There is no abdominal bruit.     Palpations: There is no mass.  Musculoskeletal:     Cervical back: Normal range of motion and neck supple.  Skin:    General: Skin is warm and dry.     Coloration: Skin is not pale.     Findings: No rash.  Neurological:     Mental Status: She is alert.     Deep Tendon Reflexes: Reflexes are normal and symmetric.     Comments: No tremor  Psychiatric:        Mood and Affect: Mood is anxious. Affect is tearful.        Thought Content: Thought content normal.        Cognition and  Memory: Cognition and memory normal.     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 -unable to safely care for her at home Reviewed stressors/ coping techniques/symptoms/ support sources/ tx options and side effects in detail today Has had to neglect herself  Thankfully investigating options for memory care and her  mother is agreeable  Discussed counseling and ref made (would benefit from CBT) Fair support  Px buspar to start 7.5 mg bid  Discussed expectations of this medication including time to effectiveness and mechanism of action, also poss of side effects (early and late)- including mental fuzziness, weight or appetite change, nausea and poss of worse dep or anxiety (even suicidal thoughts)  Pt voiced understanding and will stop med and update if this occurs   Can titrate up if helpful/needed Suspect she will not need permanent mediation        Relevant Medications   busPIRone (BUSPAR) 15 MG tablet   Other Relevant Orders   Ambulatory referral to Psychology

## 2020-07-18 NOTE — Assessment & Plan Note (Signed)
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 -unable to safely care for her at home Reviewed stressors/ coping techniques/symptoms/ support sources/ tx options and side effects in detail today Has had to neglect herself  Thankfully investigating options for memory care and her mother is agreeable  Discussed counseling and ref made (would benefit from CBT) Fair support  Px buspar to start 7.5 mg bid  Discussed expectations of this medication including time to effectiveness and mechanism of action, also poss of side effects (early and late)- including mental fuzziness, weight or appetite change, nausea and poss of worse dep or anxiety (even suicidal thoughts)  Pt voiced understanding and will stop med and update if this occurs   Can titrate up if helpful/needed Suspect she will not need permanent mediation

## 2020-07-18 NOTE — Patient Instructions (Addendum)
I placed a counseling referral -the office will call you   Check out the meditation apps  Calm and Headspace   Eventually exercise will help  Self care is important  Talking to your support group also   Start buspar 7.5 mg twice daily  We can titrate up later if needed   If any intolerable side effects or if you feel worse- stop it and let us know

## 2020-07-19 ENCOUNTER — Ambulatory Visit (INDEPENDENT_AMBULATORY_CARE_PROVIDER_SITE_OTHER): Payer: Self-pay | Admitting: Dermatology

## 2020-07-19 DIAGNOSIS — L308 Other specified dermatitis: Secondary | ICD-10-CM

## 2020-07-19 MED ORDER — CLOBETASOL PROPIONATE 0.05 % EX OINT: 1.0000 "application " | TOPICAL_OINTMENT | Freq: Two times a day (BID) | CUTANEOUS | 2 refills | Status: DC

## 2020-07-19 NOTE — Patient Instructions (Signed)
Recommend VaniCream

## 2020-07-19 NOTE — Progress Notes (Signed)
   Follow-Up Visit   Subjective  Katherine Macdonald is a 64 y.o. female who presents for the following: Follow-up (Pt here for suture removal and to discuss biopsy results).   The following portions of the chart were reviewed this encounter and updated as appropriate: Tobacco  Allergies  Meds  Problems  Med Hx  Surg Hx  Fam Hx      Review of Systems: No other skin or systemic complaints except as noted in HPI or Assessment and Plan.   Objective  Well appearing patient in no apparent distress; mood and affect are within normal limits.  A focused examination was performed including right axilla and left foot. Relevant physical exam findings are noted in the Assessment and Plan.  Objective  right axilla and left second toe:   Assessment & Plan  Other eczema right axilla and left second toe  Wound cleansed, sutures removed, wound cleansed and mupirocin and a bandage applied. Discussed pathology results.  Biopsy consistent with Chronic Contact dermatitis or Eczema.   She has improvement at axilla but toe has been persistent and symptomatic.   D/c mometasone cream. Start clobetasol ointment to toe. Apply BID around affected area on toe for 2 weeks. Then decrease to weekend use only. Avoid applying to face, groin, and axilla. Use as directed. Risk of skin atrophy with long-term use reviewed.   Continue pimecrolimus cream to face and underarms as directed.   Follow up with Dr. Nehemiah Massed in January.   If persistent, discussed re-biopsy to R/O CTCL, Alpha-Gal testing, or a more extensive patch testing as per Dr. Erling Conte result note.   Topical steroids (such as triamcinolone, fluocinolone, fluocinonide, mometasone, clobetasol, halobetasol, betamethasone, hydrocortisone) can cause thinning and lightening of the skin if they are used for too long in the same area. Your physician has selected the right strength medicine for your problem and area affected on the body. Please use your  medication only as directed by your physician to prevent side effects.    Ordered Medications: clobetasol ointment (TEMOVATE) 0.05 %  Return in about 2 months (around 09/18/2020) for eczema with Dr. Nehemiah Massed.   I, Harriett Sine, CMA, am acting as scribe for Forest Gleason, MD.   Documentation: I have reviewed the above documentation for accuracy and completeness, and I agree with the above.  Forest Gleason, MD

## 2020-07-24 ENCOUNTER — Encounter: Payer: Self-pay | Admitting: Dermatology

## 2020-08-02 ENCOUNTER — Ambulatory Visit
Admission: RE | Admit: 2020-08-02 | Discharge: 2020-08-02 | Disposition: A | Discharge status: Home / Self Care | Payer: Self-pay | Source: Ambulatory Visit | Attending: Family Medicine | Admitting: Family Medicine

## 2020-08-02 ENCOUNTER — Other Ambulatory Visit: Payer: Self-pay

## 2020-08-02 DIAGNOSIS — Z1231 Encounter for screening mammogram for malignant neoplasm of breast: Secondary | ICD-10-CM | POA: Insufficient documentation

## 2020-08-04 ENCOUNTER — Telehealth: Payer: Self-pay

## 2020-08-04 ENCOUNTER — Ambulatory Visit (INDEPENDENT_AMBULATORY_CARE_PROVIDER_SITE_OTHER): Payer: Self-pay | Admitting: Psychology

## 2020-08-04 ENCOUNTER — Encounter: Payer: Self-pay | Admitting: Obstetrics and Gynecology

## 2020-08-04 DIAGNOSIS — F4322 Adjustment disorder with anxiety: Secondary | ICD-10-CM

## 2020-08-04 MED ORDER — BUSPIRONE HCL 15 MG PO TABS
15.0000 mg | ORAL_TABLET | Freq: Two times a day (BID) | ORAL | 5 refills | Status: DC
Start: 2020-08-04 — End: 2021-02-13

## 2020-08-04 NOTE — Telephone Encounter (Signed)
Increase to 1 pill twice daily (was on 1/2 bid)  Let me know how you do with that  [facility]

## 2020-08-04 NOTE — Telephone Encounter (Signed)
Patient is aware and will increase to 1 pill bid

## 2020-08-04 NOTE — Telephone Encounter (Signed)
Pt lmovm giving you an update on how she is doing on the Buspar  Pt reports that she has not noticed any improvement with her Sx and wanted to know if you can increase dose or change medication  Please advise

## 2020-08-29 ENCOUNTER — Telehealth (INDEPENDENT_AMBULATORY_CARE_PROVIDER_SITE_OTHER): Payer: Self-pay | Admitting: Family Medicine

## 2020-08-29 ENCOUNTER — Encounter: Payer: Self-pay | Admitting: Family Medicine

## 2020-08-29 VITALS — BP 145/73 | Temp 97.8°F | Ht 62.0 in | Wt 128.0 lb

## 2020-08-29 DIAGNOSIS — F430 Acute stress reaction: Secondary | ICD-10-CM

## 2020-08-29 DIAGNOSIS — G4700 Insomnia, unspecified: Secondary | ICD-10-CM | POA: Insufficient documentation

## 2020-08-29 DIAGNOSIS — F5104 Psychophysiologic insomnia: Secondary | ICD-10-CM

## 2020-08-29 MED ORDER — TRAZODONE HCL 50 MG PO TABS
50.0000 mg | ORAL_TABLET | Freq: Every evening | ORAL | 3 refills | Status: DC | PRN
Start: 2020-08-29 — End: 2021-05-10

## 2020-08-29 NOTE — Assessment & Plan Note (Signed)
Due to stress/anxiety  buspar has not helped occ benadryl helps or CBD gummies (not always) Is very tired She does expect stress to improve   Will try trazodone 50-100 mg qhs Will update if side eff/problems  May also help anxiety  Disc poss of over sedation-will watch for and update

## 2020-08-29 NOTE — Assessment & Plan Note (Addendum)
Pt now has plan for placement for her mom- this will alleviate worry  Saw counselor  Expect things to improve Unsure if buspar helped and will likely stop it  More sleep problems (again hopefully temporary) Will try trazodone 50-100 mg qhs Discussed expectations of this medication including time to effectiveness and mechanism of action, also poss of side effects (early and late)- including mental fuzziness, weight or appetite change, nausea and poss of worse dep or anxiety (even suicidal thoughts)  Pt voiced understanding and will stop med and update if this occurs (will update) Also disc sedation the next am-will watch for as well

## 2020-08-29 NOTE — Progress Notes (Signed)
Virtual Visit via Video Note  I connected with Katherine Macdonald on 08/29/20 at 11:00 AM EST by a video enabled telemedicine application and verified that I am speaking with the correct person using two identifiers.  Location: Patient: home  Provider: office   I discussed the limitations of evaluation and management by telemedicine and the availability of in person appointments. The patient expressed understanding and agreed to proceed.  Parties involved in encounter  Patient: Katherine Macdonald  Provider:  Roxy Manns MD    History of Present Illness: Pt presents to discuss sleep problems   Last visit we discussed stress reaction (caring for mother)  Discussed need for self care  Ref made for counseling -she had a visit with counselor -did not think she needs anything ongoing   apps did not help with sleep  Tried tylenol pm- occ helps but not always Tried CBD gummies-occ helps a lot   Transition -mother's care will be in the nxt 2 weeks  Brain will not turn off    Px buspar 7.5 mg bid -did not notice a difference so inc to 15 mg tid  Cannot tell a huge difference- may help some    she wakes up after 2 hours and then 3-4 times per night   Also wanted to discuss covid vaccine  Has not had it yet  Wanted to know if we had pref of which brand   Patient Active Problem List   Diagnosis Date Noted  . Insomnia 08/29/2020  . Stress reaction 07/18/2020  . Hyperkalemia 05/08/2020  . Urticaria 05/08/2020  . Screening mammogram, encounter for 05/01/2018  . Dermatitis 10/22/2016  . History of cholecystectomy 12/21/2013  . Chronic buttock pain 01/10/2012  . Routine general medical examination at a health care facility 01/02/2012  . Colon cancer screening 11/27/2010  . HEADACHE 07/06/2010  . HYPERCHOLESTEROLEMIA 01/30/2010  . Uterine prolapse 11/15/2008  . MENOPAUSAL SYNDROME 11/15/2008  . History of Helicobacter pylori infection 10/05/2007  . GERD 10/05/2007  . OVERACTIVE  BLADDER 10/05/2007  . FIBROCYSTIC BREAST DISEASE 10/05/2007  . HYPERHIDROSIS 10/05/2007   Past Medical History:  Diagnosis Date  . Carotid bruit 09/2007   carotid doppler- normal  . Female bladder prolapse   . GERD (gastroesophageal reflux disease)   . Overactive bladder   . Presence of pessary   . Sleep disorder    Past Surgical History:  Procedure Laterality Date  . BUNIONECTOMY     right foot  . CHOLECYSTECTOMY     Social History   Tobacco Use  . Smoking status: Never Smoker  . Smokeless tobacco: Never Used  Vaping Use  . Vaping Use: Never used  Substance Use Topics  . Alcohol use: No    Alcohol/week: 0.0 standard drinks  . Drug use: No   Family History  Problem Relation Age of Onset  . Arthritis Mother        Rheumatoid  . Dementia Mother   . Heart disease Father        CABG  . Hyperlipidemia Father   . Cancer Maternal Aunt        breast  . Breast cancer Maternal Aunt    Allergies  Allergen Reactions  . Codeine     REACTION: GI upset  . Meloxicam   . Sulfonamide Derivatives     REACTION: hives  . Zolpidem Tartrate     REACTION: ? headache   Current Outpatient Medications on File Prior to Visit  Medication Sig Dispense Refill  .  Ascorbic Acid (VITAMIN C PO) Take by mouth.    . busPIRone (BUSPAR) 15 MG tablet Take 1 tablet (15 mg total) by mouth 2 (two) times daily. 60 tablet 5  . clobetasol ointment (TEMOVATE) 0.05 % Apply 1 application topically 2 (two) times daily. 30 g 2  . mometasone (ELOCON) 0.1 % cream APPLY TO AFFECTED AREAS EVERY DAY AS DIRECTED 45 g 2  . Multiple Vitamins-Minerals (ZINC PO) Take by mouth.    . Probiotic Product (PROBIOTIC DAILY PO) Take by mouth.    Marland Kitchen VITAMIN D PO Take by mouth.     No current facility-administered medications on file prior to visit.   Review of Systems  Constitutional: Negative for chills, fever and malaise/fatigue.  HENT: Negative for congestion, ear pain, sinus pain and sore throat.   Eyes: Negative  for blurred vision, discharge and redness.  Respiratory: Negative for cough, shortness of breath and stridor.   Cardiovascular: Negative for chest pain, palpitations and leg swelling.  Gastrointestinal: Negative for abdominal pain, diarrhea, nausea and vomiting.  Musculoskeletal: Negative for myalgias.  Skin: Negative for rash.  Neurological: Negative for dizziness and headaches.  Psychiatric/Behavioral: Negative for memory loss. The patient is nervous/anxious and has insomnia.     Observations/Objective: Patient appears well, in no distress Weight is baseline  No facial swelling or asymmetry Normal voice-not hoarse and no slurred speech No obvious tremor or mobility impairment Moving neck and UEs normally Able to hear the call well  No cough or shortness of breath during interview  Talkative and mentally sharp with no cognitive changes No skin changes on face or neck , no rash or pallor Affect is normal to slightly anxious (not tearful), candidly disc her stressors and symptoms     Assessment and Plan: Problem List Items Addressed This Visit      Other   Stress reaction    Pt now has plan for placement for her mom- this will alleviate worry  Saw counselor  Expect things to improve Unsure if buspar helped and will likely stop it  More sleep problems (again hopefully temporary) Will try trazodone 50-100 mg qhs Discussed expectations of this medication including time to effectiveness and mechanism of action, also poss of side effects (early and late)- including mental fuzziness, weight or appetite change, nausea and poss of worse dep or anxiety (even suicidal thoughts)  Pt voiced understanding and will stop med and update if this occurs (will update) Also disc sedation the next am-will watch for as well         Relevant Medications   traZODone (DESYREL) 50 MG tablet   Insomnia - Primary    Due to stress/anxiety  buspar has not helped occ benadryl helps or CBD gummies (not  always) Is very tired She does expect stress to improve   Will try trazodone 50-100 mg qhs Will update if side eff/problems  May also help anxiety  Disc poss of over sedation-will watch for and update          Follow Up Instructions: Stop the buspar if it does not help  Good to hear stress should improve For sleep try the trazodone 50-100 mg at bedtime (start with the 50) Watch our for over sedation or mood change (let us know) or any other side effect  Also update if not helpful   Try to focus on self care when you can    I discussed the assessment and treatment plan with the patient. The patient was provided an  opportunity to ask questions and all were answered. The patient agreed with the plan and demonstrated an understanding of the instructions.   The patient was advised to call back or seek an in-person evaluation if the symptoms worsen or if the condition fails to improve as anticipated.     Roxy Manns, MD

## 2020-08-29 NOTE — Patient Instructions (Addendum)
Stop the buspar if it does not help  Good to hear stress should improve For sleep try the trazodone 50-100 mg at bedtime (start with the 50) Watch our for over sedation or mood change (let us know) or any other side effect  Also update if not helpful   Try to focus on self care when you can

## 2020-09-20 ENCOUNTER — Other Ambulatory Visit: Payer: Self-pay

## 2020-09-20 ENCOUNTER — Ambulatory Visit (INDEPENDENT_AMBULATORY_CARE_PROVIDER_SITE_OTHER): Payer: Self-pay | Admitting: Dermatology

## 2020-09-20 DIAGNOSIS — L308 Other specified dermatitis: Secondary | ICD-10-CM

## 2020-09-20 DIAGNOSIS — L821 Other seborrheic keratosis: Secondary | ICD-10-CM

## 2020-09-20 NOTE — Patient Instructions (Signed)

## 2020-09-20 NOTE — Progress Notes (Signed)
   Follow-Up Visit   Subjective  Katherine Macdonald is a 65 y.o. female who presents for the following: Other (Biopsy proven Chronic contact derm vs eczema - rash is better, she only has one patch on her left leg and she uses clobetasol ointment when it flares up.//She also has some scaly spots on her forehead, neck and back that she would like to discuss treatment. She has stopped eating red meat and says the her rash has improved./).  The following portions of the chart were reviewed this encounter and updated as appropriate:   Tobacco  Allergies  Meds  Problems  Med Hx  Surg Hx  Fam Hx     Review of Systems:  No other skin or systemic complaints except as noted in HPI or Assessment and Plan.  Objective  Well appearing patient in no apparent distress; mood and affect are within normal limits.  A focused examination was performed including face, back, axilla, legs. Relevant physical exam findings are noted in the Assessment and Plan.  Objective  Left Thigh - Posterior: Pink patch of left post thigh  Objective  Left forehead x 2, left cheek x 1, right neck x 1, left post shoulder x 1 (5): Stuck-on, waxy, tan-brown papule or plaque --Discussed benign etiology and prognosis.    Assessment & Plan  Eczema - biopsy proven "spongiotic dermatitis" with possible other factors involved. Left Thigh - Posterior Discussed Alpha Gal testing - patient declines today since her  rash has improved and she is avoiding red meat which may have helped.  Eucrisa onitment qd to patch of left post thigh - samples given  Continue Clobetasol oint qd prn  Other Related Medications clobetasol ointment (TEMOVATE) 0.05 %  Seborrheic keratosis (5) Left forehead x 2, left cheek x 1, right neck x 1, left post shoulder x 1  Destruction of lesion - Left forehead x 2, left cheek x 1, right neck x 1, left post shoulder x 1 Complexity: simple   Destruction method: cryotherapy   Informed consent: discussed  and consent obtained   Timeout:  patient name, date of birth, surgical site, and procedure verified Lesion destroyed using liquid nitrogen: Yes   Region frozen until ice ball extended beyond lesion: Yes   Outcome: patient tolerated procedure well with no complications   Post-procedure details: wound care instructions given    Return in about 1 year (around 09/20/2021).  I, Ashok Cordia, CMA, am acting as scribe for Sarina Ser, MD .  Documentation: I have reviewed the above documentation for accuracy and completeness, and I agree with the above.  Sarina Ser, MD

## 2020-09-22 ENCOUNTER — Encounter: Payer: Self-pay | Admitting: Dermatology

## 2021-02-13 ENCOUNTER — Encounter: Payer: Self-pay | Admitting: Obstetrics and Gynecology

## 2021-02-13 ENCOUNTER — Ambulatory Visit (INDEPENDENT_AMBULATORY_CARE_PROVIDER_SITE_OTHER): Payer: Self-pay | Admitting: Obstetrics and Gynecology

## 2021-02-13 ENCOUNTER — Other Ambulatory Visit: Payer: Self-pay

## 2021-02-13 VITALS — BP 126/75 | HR 61 | Ht 62.0 in | Wt 128.3 lb

## 2021-02-13 DIAGNOSIS — Z4689 Encounter for fitting and adjustment of other specified devices: Secondary | ICD-10-CM

## 2021-02-13 DIAGNOSIS — R351 Nocturia: Secondary | ICD-10-CM

## 2021-02-13 DIAGNOSIS — N812 Incomplete uterovaginal prolapse: Secondary | ICD-10-CM

## 2021-02-13 DIAGNOSIS — N393 Stress incontinence (female) (male): Secondary | ICD-10-CM

## 2021-02-13 DIAGNOSIS — R339 Retention of urine, unspecified: Secondary | ICD-10-CM

## 2021-02-13 MED ORDER — PREMARIN 0.625 MG/GM VA CREA: 1.0000 | TOPICAL_CREAM | VAGINAL | 6 refills | Status: DC

## 2021-02-13 NOTE — Progress Notes (Signed)
Pt present to discuss pessary and unable to hold her bladder.

## 2021-02-13 NOTE — Progress Notes (Signed)
    GYNECOLOGY PROGRESS NOTE  Subjective:    Patient ID: Katherine Macdonald, female    DOB: 07-30-1956, 65 y.o.   MRN: [id number]  HPI  Patient is a 65 y.o. G63P2002 female who presents for possible change of change of pessary. Manages the pessary herself. Has had current one in place for ~ 10--12 years (cystocele with small rectocele). Is still noting some feelings of incomplete emptying (feels the urge to void again within an hour of initial void), and nocturia (frequency of ~ 3 x per night), as well as occasional stress incontinence with coughing/sneezing.   Of note, patient reports her mother passed in May with dementia.  She notes she is overall coping well.  Does note that during that time she had not initiated the premarin cream for her atrophy.    The following portions of the patient's history were reviewed and updated as appropriate: allergies, current medications, past family history, past medical history, past social history, past surgical history, and problem list.  Review of Systems Pertinent items noted in HPI and remainder of comprehensive ROS otherwise negative.   Objective:   Blood pressure 126/75, pulse 61, height 5\' 2"  (1.575 m), weight 128 lb 4.8 oz (58.2 kg), last menstrual period 08/26/2008. General appearance: alert and no distress Abdomen: soft, non-tender; bowel sounds normal; no masses,  no organomegaly Pelvic: external genitalia normal. Cystocele present Grade 2, rectocele present, Grade 1.  Mild vaginal atrophy.  Size 5 pessary ring was removed and cleaned.    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 Will change to size 5 incontinence dish with knob.   - Encouraged use of Premarin for mild atrophy to decrease spotting with pessary maintenance and intercourse.   Will  notify patient when pessary arrives, and schedule appointment.    Rubie Maid, MD [facility]

## 2021-02-20 ENCOUNTER — Telehealth: Payer: Self-pay | Admitting: Obstetrics and Gynecology

## 2021-02-20 NOTE — Telephone Encounter (Signed)
Judeen Hammans called from [facility] asking for an alternative medication for Premarin- as this RX is over $400 for the pt. She suggested Estrace cream. She stated you can call her back at [phone] or send in new order. Please Advise.

## 2021-02-21 MED ORDER — ESTRADIOL 0.1 MG/GM VA CREA: TOPICAL_CREAM | VAGINAL | 4 refills | Status: DC

## 2021-02-21 NOTE — Telephone Encounter (Signed)
Please advise. Thanks Ryleigh Esqueda 

## 2021-02-21 NOTE — Addendum Note (Signed)
Addended by: Augusto Gamble on: 02/21/2021 11:07 PM   Modules accepted: Orders

## 2021-02-22 NOTE — Telephone Encounter (Signed)
Spoke to pt and she is aware that [facility] sent in estradiol vaginal cream to [facility] yesterday. Pt was advised to check with the pharmacy for status of the refill.

## 2021-04-09 ENCOUNTER — Telehealth: Payer: Self-pay | Admitting: *Deleted

## 2021-04-09 NOTE — Telephone Encounter (Signed)
Agree with that advisement and continue to isolate.  Please call and check in with her, thanks

## 2021-04-09 NOTE — Telephone Encounter (Signed)
Patient called stating that she had a scratchy throat last week and did a covid test which was negative. Patient stated that she is not aware of being around anyone with covid. Patient stated that she has not had any covid vaccines. Patient stated last night she started with a headache and her throat was feeling funny. Patient stated that she did a covid test today and it is positive. Patient denies any symptoms other than a headache and throat problem. Patient stated that she does not remember what day she tested last week. Patient stated that she does not feel real bad. Patient wants to know what Dr. Glori Bickers would recommend that she do. Patient declined a virtual visit. Patient was advised to rest, drink lots of fluids and eat well-balanced meals. Patient was given ER precautions and she verbalized understanding.

## 2021-04-12 NOTE — Telephone Encounter (Signed)
Left VM requesting pt to call the office back with an update on how she's feeling

## 2021-04-19 ENCOUNTER — Encounter: Payer: Self-pay | Admitting: Obstetrics and Gynecology

## 2021-04-19 ENCOUNTER — Other Ambulatory Visit: Payer: Self-pay

## 2021-04-19 ENCOUNTER — Ambulatory Visit (INDEPENDENT_AMBULATORY_CARE_PROVIDER_SITE_OTHER): Payer: Self-pay | Admitting: Obstetrics and Gynecology

## 2021-04-19 VITALS — BP 119/75 | HR 62 | Resp 16 | Ht 62.0 in | Wt 129.6 lb

## 2021-04-19 DIAGNOSIS — R351 Nocturia: Secondary | ICD-10-CM

## 2021-04-19 DIAGNOSIS — N812 Incomplete uterovaginal prolapse: Secondary | ICD-10-CM

## 2021-04-19 DIAGNOSIS — N393 Stress incontinence (female) (male): Secondary | ICD-10-CM

## 2021-04-19 DIAGNOSIS — R339 Retention of urine, unspecified: Secondary | ICD-10-CM

## 2021-04-19 DIAGNOSIS — Z4689 Encounter for fitting and adjustment of other specified devices: Secondary | ICD-10-CM

## 2021-04-19 NOTE — Telephone Encounter (Signed)
Left 2nd VM requesting pt to call back with an update. Will close encounter until pt calls back

## 2021-04-19 NOTE — Progress Notes (Signed)
    GYNECOLOGY PROGRESS NOTE  Subjective:    Patient ID: Katherine Macdonald, female    DOB: April 03, 1956, 65 y.o.   MRN: [id number]  HPI  Patient is a 65 y.o. G33P2002 female who presents for  change of pessary. Manages the pessary herself. Has had current one in place for ~ 10--12 years (cystocele with small rectocele). Is still noting some feelings of incomplete emptying (feels the urge to void again within an hour of initial void), and nocturia (frequency of ~ 3 x per night), as well as occasional stress incontinence with coughing/sneezing. The Premarin cream was not cost effective, so she tried using the estrace cream and it caused her to have vaginal itching. She would like to discuss other options or if it is necessary.   The following portions of the patient's history were reviewed and updated as appropriate: allergies, current medications, past family history, past medical history, past social history, past surgical history, and problem list.  Review of Systems Pertinent items noted in HPI and remainder of comprehensive ROS otherwise negative.   Objective:   Blood pressure 119/75, pulse 62, resp. rate 16, height '5\' 2"'$  (1.575 m), weight 129 lb 9.6 oz (58.8 kg), last menstrual period 08/26/2008.  Body mass index is 23.7 kg/m. General appearance: alert and no distress Abdomen: soft, non-tender; bowel sounds normal; no masses,  no organomegaly Pelvic: external genitalia normal. Cystocele present Grade 2, rectocele present, Grade 1.  Mild vaginal atrophy.  Size 5 pessary ring was removed and cleaned.    Assessment:   1. Pessary maintenance   2. 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 Stress incontinence   4. Nocturia   5. Incomplete bladder emptying      Plan:   - Pessary new insertion performed today, new size 5 incontinence dish with knob inserted.   - Encouraged use of Premarin for mild atrophy to decrease spotting with pessary maintenance and  intercourse.  - Discussed on insertion and removal so that patient can manage at home.  - Will f/u I 1 month on televisit to assess symptoms, unless symptoms noted.     Rubie Maid, MD [facility]

## 2021-05-02 ENCOUNTER — Telehealth: Payer: Self-pay | Admitting: Family Medicine

## 2021-05-02 DIAGNOSIS — Z0000 Encounter for general adult medical examination without abnormal findings: Secondary | ICD-10-CM

## 2021-05-02 DIAGNOSIS — E7800 Pure hypercholesterolemia, unspecified: Secondary | ICD-10-CM

## 2021-05-02 NOTE — Telephone Encounter (Signed)
-----   Message from Cloyd Stagers, RT sent at 04/16/2021 10:50 AM EDT ----- Regarding: Lab Orders for Thursday 9.8.2022 Please place lab orders for Thursday 9.8.2022, office visit for physical on Thursday 9.15.2022 Thank you, Dyke Maes RT(R)

## 2021-05-03 ENCOUNTER — Other Ambulatory Visit: Payer: Self-pay

## 2021-05-03 ENCOUNTER — Other Ambulatory Visit (INDEPENDENT_AMBULATORY_CARE_PROVIDER_SITE_OTHER): Payer: Self-pay

## 2021-05-03 DIAGNOSIS — Z0000 Encounter for general adult medical examination without abnormal findings: Secondary | ICD-10-CM

## 2021-05-03 DIAGNOSIS — E7800 Pure hypercholesterolemia, unspecified: Secondary | ICD-10-CM

## 2021-05-03 LAB — COMPREHENSIVE METABOLIC PANEL
ALT: 14 U/L (ref 0–35)
AST: 14 U/L (ref 0–37)
Albumin: 4 g/dL (ref 3.5–5.2)
Alkaline Phosphatase: 82 U/L (ref 39–117)
BUN: 19 mg/dL (ref 6–23)
CO2: 30 mEq/L (ref 19–32)
Calcium: 9.4 mg/dL (ref 8.4–10.5)
Chloride: 105 mEq/L (ref 96–112)
Creatinine, Ser: 0.93 mg/dL (ref 0.40–1.20)
GFR: 65.04 mL/min (ref >60.00)
Glucose, Bld: 82 mg/dL (ref 70–99)
Potassium: 4.5 mEq/L (ref 3.5–5.1)
Sodium: 141 mEq/L (ref 135–145)
Total Bilirubin: 0.7 mg/dL (ref 0.2–1.2)
Total Protein: 6.7 g/dL (ref 6.0–8.3)

## 2021-05-03 LAB — CBC WITH DIFFERENTIAL/PLATELET
Basophils Absolute: 0 10*3/uL (ref 0.0–0.1)
Basophils Relative: 1.4 % (ref 0.0–3.0)
Eosinophils Absolute: 0.1 10*3/uL (ref 0.0–0.7)
Eosinophils Relative: 4.1 % (ref 0.0–5.0)
HCT: 39.2 % (ref 36.0–46.0)
Hemoglobin: 12.8 g/dL (ref 12.0–15.0)
Lymphocytes Relative: 34.8 % (ref 12.0–46.0)
Lymphs Abs: 0.9 10*3/uL (ref 0.7–4.0)
MCHC: 32.7 g/dL (ref 30.0–36.0)
MCV: 92.2 fl (ref 78.0–100.0)
Monocytes Absolute: 0.3 10*3/uL (ref 0.1–1.0)
Monocytes Relative: 11.9 % (ref 3.0–12.0)
Neutro Abs: 1.3 10*3/uL — ABNORMAL LOW (ref 1.4–7.7)
Neutrophils Relative %: 47.8 % (ref 43.0–77.0)
Platelets: 277 10*3/uL (ref 150.0–400.0)
RBC: 4.25 Mil/uL (ref 3.87–5.11)
RDW: 13.6 % (ref 11.5–15.5)
WBC: 2.7 10*3/uL — ABNORMAL LOW (ref 4.0–10.5)

## 2021-05-03 LAB — TSH: TSH: 2.78 u[IU]/mL (ref 0.35–5.50)

## 2021-05-03 LAB — LIPID PANEL
Cholesterol: 205 mg/dL — ABNORMAL HIGH (ref 0–200)
HDL: 51.4 mg/dL (ref >39.00)
LDL Cholesterol: 138 mg/dL — ABNORMAL HIGH (ref 0–99)
NonHDL: 153.59
Total CHOL/HDL Ratio: 4
Triglycerides: 77 mg/dL (ref 0.0–149.0)
VLDL: 15.4 mg/dL (ref 0.0–40.0)

## 2021-05-08 ENCOUNTER — Other Ambulatory Visit: Payer: Self-pay

## 2021-05-08 ENCOUNTER — Encounter: Payer: Self-pay | Admitting: Obstetrics and Gynecology

## 2021-05-08 ENCOUNTER — Ambulatory Visit (INDEPENDENT_AMBULATORY_CARE_PROVIDER_SITE_OTHER): Payer: Self-pay | Admitting: Obstetrics and Gynecology

## 2021-05-08 VITALS — Ht 62.0 in

## 2021-05-08 DIAGNOSIS — N812 Incomplete uterovaginal prolapse: Secondary | ICD-10-CM

## 2021-05-08 DIAGNOSIS — R339 Retention of urine, unspecified: Secondary | ICD-10-CM

## 2021-05-08 DIAGNOSIS — N393 Stress incontinence (female) (male): Secondary | ICD-10-CM

## 2021-05-08 DIAGNOSIS — R351 Nocturia: Secondary | ICD-10-CM

## 2021-05-08 DIAGNOSIS — Z4689 Encounter for fitting and adjustment of other specified devices: Secondary | ICD-10-CM

## 2021-05-08 NOTE — Patient Instructions (Signed)
How to Use a Vaginal Pessary A vaginal pessary is a removable device that is placed into your vagina to support pelvic organs that droop. These organs include your uterus, bladder, and rectum. When your pelvic organs drop down into your vagina, it causes a condition called pelvic organ prolapse (POP). A pessary may be an alternative to surgery for women with POP. It may help women who leak urine when they strain or exercise (stress incontinence). This is a symptom of POP. A vaginal pessary may also be a temporary treatment for stress incontinence during pregnancy. There are several types of pessaries. All types are usually made of silicone. You can insert and remove some on your own. Other types must be inserted and removed by your health care provider at office visits. The reason you are using a pessary and the severity of your condition will determine which one is best for you. It is also important to find the right size. A pessary that is too small may fall out. A pessary that is too large may cause pain or discomfort. Your health care provider will do a physical exam to find the correct size and fit for your pessary. It may take several appointments to find the best fit for you. If you can be fit with the type of pessary that you can insert, remove, and clean yourself, your health care provider will teach you how to use your pessary at home. You may have checkups every few months. If you have the type of pessary that needs to be inserted and removed by your health care provider, you will have appointments every few months to have the pessary removed, cleaned, and replaced. What are the risks? When properly fitted and cared for, risks of using a vaginal pessary can be small. However, there can be problems that may include: Vaginal discharge. Vaginal bleeding. A bad smell coming from your vagina. Scraping of the skin inside your vagina. How to use your pessary Follow your health care provider's  instructions for using a pessary. These instructions may vary, depending on the type of pessary you have. To insert a pessary: Wash your hands with soap and water for at least 20 seconds. Squeeze or fold the pessary in half and lubricate the tip with a water-based lubricant. Insert the pessary into your vagina. It will unfold and provide support. To remove the pessary, gently tug it out of your vagina. You can remove the pessary every night or after several days. You can also remove it to have sex. How to care for your pessary If you have a pessary that you can remove: Clean your pessary with soap and water. Rinse well. Dry it completely before inserting it back into your vagina. Follow these instructions at home: Take over-the-counter and prescription medicines only as told by your health care provider. Your health care provider may prescribe an estrogen cream to moisten your vagina. Keep all follow-up visits. This is important. Contact a health care provider if: You feel any pain or discomfort when your pessary is in place. You continue to have stress incontinence. You have trouble keeping your pessary from falling out. You have an unusual vaginal discharge that is blood-tinged or smells bad. Summary A vaginal pessary is a removable device that is placed into your vagina to support pelvic organs that droop. This condition is called pelvic organ prolapse (POP). There are several types of pessaries. Some you can insert and remove on your own. Others must be inserted and removed  by your health care provider. The best type for you depends on the reason you are using a pessary and the severity of your condition. It is also important to find the right size. If you can use the type that you insert and remove on your own, your health care provider will teach you how to use it and schedule checkups every few months. If you have the type that needs to be inserted and removed by your health care  provider, you will have regular appointments to have your pessary removed, cleaned, and replaced. This information is not intended to replace advice given to you by your health care provider. Make sure you discuss any questions you have with your health care provider. Document Revised: 02/10/2020 Document Reviewed: 02/10/2020 Elsevier Patient Education  [place].

## 2021-05-08 NOTE — Progress Notes (Signed)
Virtual Visit via Telephone Note  I connected with Katherine Macdonald on 05/08/21 at  4:00 PM EDT by telephone and verified that I am speaking with the correct person using two identifiers.  Location: Patient: Home Provider: Office   I discussed the limitations, risks, security and privacy concerns of performing an evaluation and management service by telephone and the availability of in person appointments. I also discussed with the patient that there may be a patient responsible charge related to this service. The patient expressed understanding and agreed to proceed.  HPI  Patient is a 66 y.o. G61P2002 female with a h/o cystocele with small rectocele who presents for pessary check. Pessary exchanged ~ 2 weeks ago due to worsening prolapse symptoms (incomplete bladder emptying, nocturia, and occasional stress incontinence). Manages the pessary herself. Notes that her symptoms have improved with use of the new pessary (changed form Size 5 pessary with support to Size 5 incontinence dish with knob). Does report having some spotting every now and then with removal and insertion of the pessary.   The following portions of the patient's history were reviewed and updated as appropriate: allergies, current medications, past family history, past medical history, past social history, past surgical history, and problem list.  Review of Systems Pertinent items noted in HPI and remainder of comprehensive ROS otherwise negative.   Objective  Height '5\' 2"'$  (1.575 m), last menstrual period 08/26/2008.  Gen App: NAD   Assessment and Plan:  1. Pessary maintenance   2. 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 Stress incontinence   4. Nocturia   5. Incomplete bladder emptying      Follow Up Instructions: Continue pessary maintenance at home. Can f/u in 1 year for pessary check.    I discussed the assessment and treatment plan with the patient. The patient was provided an  opportunity to ask questions and all were answered. The patient agreed with the plan and demonstrated an understanding of the instructions.   The patient was advised to call back or seek an in-person evaluation if the symptoms worsen or if the condition fails to improve as anticipated.  I provided 6 minutes of non-face-to-face time during this encounter.   Rubie Maid, MD [facility]

## 2021-05-10 ENCOUNTER — Ambulatory Visit (INDEPENDENT_AMBULATORY_CARE_PROVIDER_SITE_OTHER): Payer: Self-pay | Admitting: Family Medicine

## 2021-05-10 ENCOUNTER — Other Ambulatory Visit: Payer: Self-pay

## 2021-05-10 ENCOUNTER — Encounter: Payer: Self-pay | Admitting: Family Medicine

## 2021-05-10 VITALS — BP 122/70 | HR 67 | Temp 97.7°F | Ht 61.5 in | Wt 127.5 lb

## 2021-05-10 DIAGNOSIS — Z0000 Encounter for general adult medical examination without abnormal findings: Secondary | ICD-10-CM

## 2021-05-10 DIAGNOSIS — E7800 Pure hypercholesterolemia, unspecified: Secondary | ICD-10-CM

## 2021-05-10 NOTE — Patient Instructions (Addendum)
If you are interested in the new shingles vaccine (Shingrix) - call your local pharmacy to check on coverage and availability  If affordable, get on a wait list at your pharmacy to get the vaccine.   For cholesterol Avoid red meat/ fried foods/ egg yolks/ fatty breakfast meats/ butter, cheese and high fat dairy/ and shellfish    Take care of yourself  Keep up the good diet and exercise

## 2021-05-10 NOTE — Assessment & Plan Note (Signed)
Disc goals for lipids and reasons to control them Rev last labs with pt Rev low sat fat diet in detail  Overall stable  Good diet - cut out some red meat Would rather avoid statin at this time but will continue to watch

## 2021-05-10 NOTE — Progress Notes (Signed)
Subjective:    Patient ID: Katherine Macdonald, female    DOB: [birth year], 65 y.o.   MRN: [id number]  This visit occurred during the SARS-CoV-2 public health emergency.  Safety protocols were in place, including screening questions prior to the visit, additional usage of staff PPE, and extensive cleaning of exam room while observing appropriate contact time as indicated for disinfecting solutions.   HPI Here for health maintenance exam and to review chronic medical problems    Wt Readings from Last 3 Encounters:  05/10/21 127 lb 8 oz (57.8 kg)  04/19/21 129 lb 9.6 oz (58.8 kg)  02/13/21 128 lb 4.8 oz (58.2 kg)   23.70 kg/m Doing well  Taking care of herself   Mom passed away in 2023-02-11 -ok with that  Less stress and pressure now   Zoster status- wants to wait until medicare and get shingrix  Flu shot-declines Tdap 5/16  Mammogram 12/21 Self breast exam-no lumps  M aunt had breast cancer  Colonoscopy 8/14  Pap 10/21 nl with neg HPV  New pessary with estrogen vaginal  Sees Dr Marcelline Mates (gyn)   BP Readings from Last 3 Encounters:  05/10/21 122/70  04/19/21 119/75  02/13/21 126/75   Pulse Readings from Last 3 Encounters:  05/10/21 67  04/19/21 62  02/13/21 61   Exercise-walking  Eating healthy - back to normal routine   Hyperlipidemia Lab Results  Component Value Date   CHOL 205 (H) 05/03/2021   CHOL 224 (H) 05/02/2020   CHOL 215 (H) 04/28/2019   Lab Results  Component Value Date   HDL 51.40 05/03/2021   HDL 62.70 05/02/2020   HDL 61.00 04/28/2019   Lab Results  Component Value Date   LDLCALC 138 (H) 05/03/2021   LDLCALC 143 (H) 05/02/2020   LDLCALC 136 (H) 04/28/2019   Lab Results  Component Value Date   TRIG 77.0 05/03/2021   TRIG 93.0 05/02/2020   TRIG 87.0 04/28/2019   Lab Results  Component Value Date   CHOLHDL 4 05/03/2021   CHOLHDL 4 05/02/2020   CHOLHDL 4 04/28/2019   Lab Results  Component Value Date   LDLDIRECT 133.0 01/06/2013   She  has cut way back on red meat  1-2 times per month  No fried food/bacon or sausage -special occasions    Other labs Results for orders placed or performed in visit on 05/03/21  TSH  Result Value Ref Range   TSH 2.78 0.35 - 5.50 uIU/mL  Lipid panel  Result Value Ref Range   Cholesterol 205 (H) 0 - 200 mg/dL   Triglycerides 77.0 0.0 - 149.0 mg/dL   HDL 51.40 >39.00 mg/dL   VLDL 15.4 0.0 - 40.0 mg/dL   LDL Cholesterol 138 (H) 0 - 99 mg/dL   Total CHOL/HDL Ratio 4    NonHDL 153.59   Comprehensive metabolic panel  Result Value Ref Range   Sodium 141 135 - 145 mEq/L   Potassium 4.5 3.5 - 5.1 mEq/L   Chloride 105 96 - 112 mEq/L   CO2 30 19 - 32 mEq/L   Glucose, Bld 82 70 - 99 mg/dL   BUN 19 6 - 23 mg/dL   Creatinine, Ser 0.93 0.40 - 1.20 mg/dL   Total Bilirubin 0.7 0.2 - 1.2 mg/dL   Alkaline Phosphatase 82 39 - 117 U/L   AST 14 0 - 37 U/L   ALT 14 0 - 35 U/L   Total Protein 6.7 6.0 - 8.3 g/dL   Albumin 4.0  3.5 - 5.2 g/dL   GFR 65.04 >60.00 mL/min   Calcium 9.4 8.4 - 10.5 mg/dL  CBC with Differential/Platelet  Result Value Ref Range   WBC 2.7 (L) 4.0 - 10.5 K/uL   RBC 4.25 3.87 - 5.11 Mil/uL   Hemoglobin 12.8 12.0 - 15.0 g/dL   HCT 39.2 36.0 - 46.0 %   MCV 92.2 78.0 - 100.0 fl   MCHC 32.7 30.0 - 36.0 g/dL   RDW 13.6 11.5 - 15.5 %   Platelets 277.0 150.0 - 400.0 K/uL   Neutrophils Relative % 47.8 43.0 - 77.0 %   Lymphocytes Relative 34.8 12.0 - 46.0 %   Monocytes Relative 11.9 3.0 - 12.0 %   Eosinophils Relative 4.1 0.0 - 5.0 %   Basophils Relative 1.4 0.0 - 3.0 %   Neutro Abs 1.3 (L) 1.4 - 7.7 K/uL   Lymphs Abs 0.9 0.7 - 4.0 K/uL   Monocytes Absolute 0.3 0.1 - 1.0 K/uL   Eosinophils Absolute 0.1 0.0 - 0.7 K/uL   Basophils Absolute 0.0 0.0 - 0.1 K/uL   She did have covid 2-3 weeks prior  Did ok   She no longer needs trazodone to sleep    Patient Active Problem List   Diagnosis Date Noted   Insomnia 08/29/2020   Hyperkalemia 05/08/2020   Urticaria 05/08/2020    Screening mammogram, encounter for 05/01/2018   Dermatitis 10/22/2016   History of cholecystectomy 12/21/2013   Chronic buttock pain 01/10/2012   Routine general medical examination at a health care facility 01/02/2012   Colon cancer screening 11/27/2010   HEADACHE 07/06/2010   HYPERCHOLESTEROLEMIA 01/30/2010   Uterine prolapse 11/15/2008   MENOPAUSAL SYNDROME 99991111   History of Helicobacter pylori infection 10/05/2007   GERD 10/05/2007   OVERACTIVE BLADDER 10/05/2007   FIBROCYSTIC BREAST DISEASE 10/05/2007   HYPERHIDROSIS 10/05/2007   Past Medical History:  Diagnosis Date   Anxiety    Carotid bruit 09/27/2007   carotid doppler- normal   Depression    Female bladder prolapse    GERD (gastroesophageal reflux disease)    Overactive bladder    Presence of pessary    Sleep disorder    Past Surgical History:  Procedure Laterality Date   BUNIONECTOMY     right foot   CHOLECYSTECTOMY     Social History   Tobacco Use   Smoking status: Never   Smokeless tobacco: Never  Vaping Use   Vaping Use: Never used  Substance Use Topics   Alcohol use: No    Alcohol/week: 0.0 standard drinks   Drug use: No   Family History  Problem Relation Age of Onset   Arthritis Mother        Rheumatoid   Dementia Mother    Heart disease Father        CABG   Hyperlipidemia Father    Cancer Maternal Aunt        breast   Breast cancer Maternal Aunt    Allergies  Allergen Reactions   Codeine     REACTION: GI upset   Meloxicam    Sulfonamide Derivatives     REACTION: hives   Zolpidem Tartrate     REACTION: ? headache   Current Outpatient Medications on File Prior to Visit  Medication Sig Dispense Refill   Ascorbic Acid (VITAMIN C PO) Take by mouth.     clobetasol ointment (TEMOVATE) [id number] % Apply 1 application topically 2 (two) times daily. 30 g 2   estradiol (ESTRACE) 0.1 MG/GM vaginal  cream Apply 1 gram per vagina every night for 2 weeks, then apply three times a week 30 g 4    mometasone (ELOCON) 0.1 % cream APPLY TO AFFECTED AREAS EVERY DAY AS DIRECTED 45 g 2   Multiple Vitamins-Minerals (ZINC PO) Take by mouth.     Probiotic Product (PROBIOTIC DAILY PO) Take by mouth.     VITAMIN D PO Take by mouth.     No current facility-administered medications on file prior to visit.     Review of Systems  Constitutional:  Negative for activity change, appetite change, fatigue, fever and unexpected weight change.  HENT:  Negative for congestion, ear pain, rhinorrhea, sinus pressure and sore throat.   Eyes:  Negative for pain, redness and visual disturbance.  Respiratory:  Negative for cough, shortness of breath and wheezing.   Cardiovascular:  Negative for chest pain and palpitations.  Gastrointestinal:  Negative for abdominal pain, blood in stool, constipation and diarrhea.  Endocrine: Negative for polydipsia and polyuria.  Genitourinary:  Negative for dysuria, frequency and urgency.  Musculoskeletal:  Negative for arthralgias, back pain and myalgias.  Skin:  Negative for pallor and rash.  Allergic/Immunologic: Negative for environmental allergies.  Neurological:  Negative for dizziness, syncope and headaches.  Hematological:  Negative for adenopathy. Does not bruise/bleed easily.  Psychiatric/Behavioral:  Negative for decreased concentration and dysphoric mood. The patient is not nervous/anxious.       Objective:   Physical Exam Constitutional:      General: She is not in acute distress.    Appearance: Normal appearance. She is well-developed and normal weight. She is not ill-appearing or diaphoretic.  HENT:     Head: Normocephalic and atraumatic.     Right Ear: Tympanic membrane, ear canal and external ear normal.     Left Ear: Tympanic membrane, ear canal and external ear normal.     Nose: Nose normal. No congestion.     Mouth/Throat:     Mouth: Mucous membranes are moist.     Pharynx: Oropharynx is clear. No posterior oropharyngeal erythema.  Eyes:      General: No scleral icterus.    Extraocular Movements: Extraocular movements intact.     Conjunctiva/sclera: Conjunctivae normal.     Pupils: Pupils are equal, round, and reactive to light.  Neck:     Thyroid: No thyromegaly.     Vascular: No carotid bruit or JVD.  Cardiovascular:     Rate and Rhythm: Normal rate and regular rhythm.     Pulses: Normal pulses.     Heart sounds: Normal heart sounds.    No gallop.  Pulmonary:     Effort: Pulmonary effort is normal. No respiratory distress.     Breath sounds: Normal breath sounds. No wheezing.     Comments: Good air exch Chest:     Chest wall: No tenderness.  Abdominal:     General: Bowel sounds are normal. There is no distension or abdominal bruit.     Palpations: Abdomen is soft. There is no mass.     Tenderness: There is no abdominal tenderness.     Hernia: No hernia is present.  Genitourinary:    Comments: Breast exam: No mass, nodules, thickening, tenderness, bulging, retraction, inflamation, nipple discharge or skin changes noted.  No axillary or clavicular LA.     Musculoskeletal:        General: No tenderness. Normal range of motion.     Cervical back: Normal range of motion and neck supple. No rigidity. No  muscular tenderness.     Right lower leg: No edema.     Left lower leg: No edema.     Comments: No kyphosis   Lymphadenopathy:     Cervical: No cervical adenopathy.  Skin:    General: Skin is warm and dry.     Coloration: Skin is not pale.     Findings: No erythema or rash.     Comments: Some SK on extremities/legs  Solar lentigines diffusely   Neurological:     Mental Status: She is alert. Mental status is at baseline.     Cranial Nerves: No cranial nerve deficit.     Motor: No abnormal muscle tone.     Coordination: Coordination normal.     Gait: Gait normal.     Deep Tendon Reflexes: Reflexes are normal and symmetric. Reflexes normal.  Psychiatric:        Mood and Affect: Mood normal.        Cognition and  Memory: Cognition and memory normal.          Assessment & Plan:   Problem List Items Addressed This Visit       Other   HYPERCHOLESTEROLEMIA    Disc goals for lipids and reasons to control them Rev last labs with pt Rev low sat fat diet in detail  Overall stable  Good diet - cut out some red meat Would rather avoid statin at this time but will continue to watch      Routine general medical examination at a health care facility - Primary    Reviewed health habits including diet and exercise and skin cancer prevention Reviewed appropriate screening tests for age  Also reviewed health mt list, fam hx and immunization status , as well as social and family history   See HPI Labs reviewed  Declines flu shot  Had covid 2-3 wk ago  Good health habits Mammogram and colonoscopy utd and has gyn care  Pap utd  Antic guidance given  Wants to consider shingrix when she gets medicare

## 2021-05-10 NOTE — Assessment & Plan Note (Signed)
Reviewed health habits including diet and exercise and skin cancer prevention Reviewed appropriate screening tests for age  Also reviewed health mt list, fam hx and immunization status , as well as social and family history   See HPI Labs reviewed  Declines flu shot  Had covid 2-3 wk ago  Good health habits Mammogram and colonoscopy utd and has gyn care  Pap utd  Antic guidance given  Wants to consider shingrix when she gets medicare

## 2021-07-04 ENCOUNTER — Encounter: Payer: Self-pay | Admitting: Dermatology

## 2021-07-04 ENCOUNTER — Other Ambulatory Visit: Payer: Self-pay

## 2021-07-04 ENCOUNTER — Ambulatory Visit (INDEPENDENT_AMBULATORY_CARE_PROVIDER_SITE_OTHER): Payer: Self-pay | Admitting: Dermatology

## 2021-07-04 DIAGNOSIS — D1800 Hemangioma unspecified site: Secondary | ICD-10-CM

## 2021-07-04 DIAGNOSIS — D229 Melanocytic nevi, unspecified: Secondary | ICD-10-CM

## 2021-07-04 DIAGNOSIS — L814 Other melanin hyperpigmentation: Secondary | ICD-10-CM

## 2021-07-04 DIAGNOSIS — Z1283 Encounter for screening for malignant neoplasm of skin: Secondary | ICD-10-CM

## 2021-07-04 DIAGNOSIS — L820 Inflamed seborrheic keratosis: Secondary | ICD-10-CM

## 2021-07-04 DIAGNOSIS — L821 Other seborrheic keratosis: Secondary | ICD-10-CM

## 2021-07-04 DIAGNOSIS — L578 Other skin changes due to chronic exposure to nonionizing radiation: Secondary | ICD-10-CM

## 2021-07-04 NOTE — Patient Instructions (Signed)

## 2021-07-04 NOTE — Progress Notes (Signed)
   Follow-Up Visit   Subjective  Katherine Macdonald is a 65 y.o. female who presents for the following: Annual Exam (Mole check ). Yearly mole check. Pt c/o irritated spots on her neck.  The patient presents for Total-Body Skin Exam (TBSE) for skin cancer screening and mole check.   The following portions of the chart were reviewed this encounter and updated as appropriate:   Tobacco  Allergies  Meds  Problems  Med Hx  Surg Hx  Fam Hx     Review of Systems:  No other skin or systemic complaints except as noted in HPI or Assessment and Plan.  Objective  Well appearing patient in no apparent distress; mood and affect are within normal limits.  A full examination was performed including scalp, head, eyes, ears, nose, lips, neck, chest, axillae, abdomen, back, buttocks, bilateral upper extremities, bilateral lower extremities, hands, feet, fingers, toes, fingernails, and toenails. All findings within normal limits unless otherwise noted below.  right lateral neck x 1 Erythematous keratotic or waxy stuck-on papule or plaque.    Assessment & Plan  Inflamed seborrheic keratosis right lateral neck x 1  Reassured benign age-related growth.  Recommend observation.  Discussed cryotherapy if spot(s) become irritated or inflamed.   Prior to procedure, discussed risks of blister formation, small wound, skin dyspigmentation, or rare scar following cryotherapy. Recommend Vaseline ointment to treated areas while healing.   Destruction of lesion - right lateral neck x 1 Complexity: simple   Destruction method: cryotherapy   Informed consent: discussed and consent obtained   Timeout:  patient name, date of birth, surgical site, and procedure verified Lesion destroyed using liquid nitrogen: Yes   Region frozen until ice ball extended beyond lesion: Yes   Outcome: patient tolerated procedure well with no complications   Post-procedure details: wound care instructions given    Skin cancer  screening  Lentigines - Scattered tan macules - Due to sun exposure - Benign-appearing, observe - Recommend daily broad spectrum sunscreen SPF 30+ to sun-exposed areas, reapply every 2 hours as needed. - Call for any changes  Seborrheic Keratoses - Stuck-on, waxy, tan-brown papules and/or plaques  - Benign-appearing - Discussed benign etiology and prognosis. - Observe - Call for any changes  Melanocytic Nevi - Tan-brown and/or pink-flesh-colored symmetric macules and papules - Benign appearing on exam today - Observation - Call clinic for new or changing moles - Recommend daily use of broad spectrum spf 30+ sunscreen to sun-exposed areas.   Hemangiomas - Red papules - Discussed benign nature - Observe - Call for any changes  Actinic Damage - Chronic condition, secondary to cumulative UV/sun exposure - diffuse scaly erythematous macules with underlying dyspigmentation - Recommend daily broad spectrum sunscreen SPF 30+ to sun-exposed areas, reapply every 2 hours as needed.  - Staying in the shade or wearing long sleeves, sun glasses (UVA+UVB protection) and wide brim hats (4-inch brim around the entire circumference of the hat) are also recommended for sun protection.  - Call for new or changing lesions.  Skin cancer screening performed today.  No follow-ups on file.  IMarye Round, CMA, am acting as scribe for Sarina Ser, MD .  Documentation: I have reviewed the above documentation for accuracy and completeness, and I agree with the above.  Sarina Ser, MD

## 2021-11-23 ENCOUNTER — Encounter: Payer: Self-pay | Admitting: Obstetrics and Gynecology

## 2022-02-28 ENCOUNTER — Other Ambulatory Visit: Payer: Self-pay | Admitting: Family Medicine

## 2022-02-28 DIAGNOSIS — Z1231 Encounter for screening mammogram for malignant neoplasm of breast: Secondary | ICD-10-CM

## 2022-03-25 ENCOUNTER — Ambulatory Visit
Admission: RE | Admit: 2022-03-25 | Discharge: 2022-03-25 | Disposition: A | Discharge status: Home / Self Care | Payer: Medicare Other | Source: Ambulatory Visit | Attending: Family Medicine | Admitting: Family Medicine

## 2022-03-25 DIAGNOSIS — Z1231 Encounter for screening mammogram for malignant neoplasm of breast: Secondary | ICD-10-CM | POA: Insufficient documentation

## 2022-05-08 ENCOUNTER — Encounter: Payer: Medicare Other | Admitting: Obstetrics and Gynecology

## 2022-05-09 ENCOUNTER — Telehealth: Payer: Self-pay | Admitting: Family Medicine

## 2022-05-09 DIAGNOSIS — E7800 Pure hypercholesterolemia, unspecified: Secondary | ICD-10-CM

## 2022-05-09 DIAGNOSIS — Z0000 Encounter for general adult medical examination without abnormal findings: Secondary | ICD-10-CM | POA: Insufficient documentation

## 2022-05-09 NOTE — Telephone Encounter (Signed)
She will need to sign the waiver for [employer]

## 2022-05-09 NOTE — Telephone Encounter (Signed)
-----   Message from Ellamae Sia sent at 05/03/2022  5:04 PM EDT ----- Regarding: Lab orders for Friday, 9.15.23 Patient is scheduled for CPX labs, please order future labs, Thanks , Karna Christmas

## 2022-05-10 ENCOUNTER — Other Ambulatory Visit (INDEPENDENT_AMBULATORY_CARE_PROVIDER_SITE_OTHER): Payer: Medicare Other

## 2022-05-10 DIAGNOSIS — E7800 Pure hypercholesterolemia, unspecified: Secondary | ICD-10-CM | POA: Diagnosis not present

## 2022-05-10 DIAGNOSIS — Z0000 Encounter for general adult medical examination without abnormal findings: Secondary | ICD-10-CM | POA: Diagnosis not present

## 2022-05-10 LAB — COMPREHENSIVE METABOLIC PANEL
ALT: 17 U/L (ref 0–35)
AST: 16 U/L (ref 0–37)
Albumin: 4.1 g/dL (ref 3.5–5.2)
Alkaline Phosphatase: 77 U/L (ref 39–117)
BUN: 19 mg/dL (ref 6–23)
CO2: 30 mEq/L (ref 19–32)
Calcium: 9.5 mg/dL (ref 8.4–10.5)
Chloride: 104 mEq/L (ref 96–112)
Creatinine, Ser: 0.97 mg/dL (ref 0.40–1.20)
GFR: 61.4 mL/min (ref >60.00)
Glucose, Bld: 92 mg/dL (ref 70–99)
Potassium: 4.1 mEq/L (ref 3.5–5.1)
Sodium: 140 mEq/L (ref 135–145)
Total Bilirubin: 0.5 mg/dL (ref 0.2–1.2)
Total Protein: 7 g/dL (ref 6.0–8.3)

## 2022-05-10 LAB — LIPID PANEL
Cholesterol: 207 mg/dL — ABNORMAL HIGH (ref 0–200)
HDL: 57.5 mg/dL (ref >39.00)
LDL Cholesterol: 134 mg/dL — ABNORMAL HIGH (ref 0–99)
NonHDL: 149.4
Total CHOL/HDL Ratio: 4
Triglycerides: 77 mg/dL (ref 0.0–149.0)
VLDL: 15.4 mg/dL (ref 0.0–40.0)

## 2022-05-10 LAB — CBC WITH DIFFERENTIAL/PLATELET
Basophils Absolute: 0 10*3/uL (ref 0.0–0.1)
Basophils Relative: 1 % (ref 0.0–3.0)
Eosinophils Absolute: 0.1 10*3/uL (ref 0.0–0.7)
Eosinophils Relative: 3 % (ref 0.0–5.0)
HCT: 40 % (ref 36.0–46.0)
Hemoglobin: 13.2 g/dL (ref 12.0–15.0)
Lymphocytes Relative: 25.8 % (ref 12.0–46.0)
Lymphs Abs: 1 10*3/uL (ref 0.7–4.0)
MCHC: 33 g/dL (ref 30.0–36.0)
MCV: 92 fl (ref 78.0–100.0)
Monocytes Absolute: 0.3 10*3/uL (ref 0.1–1.0)
Monocytes Relative: 7 % (ref 3.0–12.0)
Neutro Abs: 2.4 10*3/uL (ref 1.4–7.7)
Neutrophils Relative %: 63.2 % (ref 43.0–77.0)
Platelets: 237 10*3/uL (ref 150.0–400.0)
RBC: 4.35 Mil/uL (ref 3.87–5.11)
RDW: 13.6 % (ref 11.5–15.5)
WBC: 3.8 10*3/uL — ABNORMAL LOW (ref 4.0–10.5)

## 2022-05-10 LAB — TSH: TSH: 1.94 u[IU]/mL (ref 0.35–5.50)

## 2022-05-23 ENCOUNTER — Encounter: Payer: Self-pay | Admitting: Family Medicine

## 2022-05-23 ENCOUNTER — Ambulatory Visit (INDEPENDENT_AMBULATORY_CARE_PROVIDER_SITE_OTHER): Payer: Medicare Other | Admitting: Family Medicine

## 2022-05-23 VITALS — BP 130/81 | HR 56 | Temp 97.8°F | Ht 61.5 in | Wt 132.1 lb

## 2022-05-23 DIAGNOSIS — Z1211 Encounter for screening for malignant neoplasm of colon: Secondary | ICD-10-CM

## 2022-05-23 DIAGNOSIS — E7800 Pure hypercholesterolemia, unspecified: Secondary | ICD-10-CM

## 2022-05-23 DIAGNOSIS — Z0000 Encounter for general adult medical examination without abnormal findings: Secondary | ICD-10-CM | POA: Diagnosis not present

## 2022-05-23 DIAGNOSIS — E2839 Other primary ovarian failure: Secondary | ICD-10-CM | POA: Diagnosis not present

## 2022-05-23 MED ORDER — TRAZODONE HCL 50 MG PO TABS: 50.0000 mg | ORAL_TABLET | Freq: Every evening | ORAL | 3 refills | Status: DC | PRN

## 2022-05-23 NOTE — Assessment & Plan Note (Signed)
Disc goals for lipids and reasons to control them Rev last labs with pt Rev low sat fat diet in detail Stable, fair  Disc goal of LDL under 100 (now 134) Rev ASCVD risk score  Will work on diet  May consider statin later if not imp

## 2022-05-23 NOTE — Progress Notes (Signed)
Subjective:    Patient ID: Katherine Macdonald, female    DOB: 08-30-1955, 66 y.o.   MRN: [id number]  HPI Pt presents for welcome to medicare visit   I have personally reviewed the Medicare Annual Wellness questionnaire and have noted 1. The patient's medical and social history 2. Their use of alcohol, tobacco or illicit drugs 3. Their current medications and supplements 4. The patient's functional ability including ADL's, fall risks, home safety risks and hearing or visual             impairment. 5. Diet and physical activities 6. Evidence for depression or mood disorders  The patients weight, height, BMI have been recorded in the chart and visual acuity is per eye clinic.  I have made referrals, counseling and provided education to the patient based review of the above and I have provided the pt with a written personalized care plan for preventive services. Reviewed and updated provider list, see scanned forms.  See scanned forms.  Routine anticipatory guidance given to patient.  See health maintenance. Colon cancer screening   colonoscopy 03/2013 - due next august  Breast cancer screening mammogram 02/2022 Self breast exam: no lumps  Went to gyn last year /wants to see another one/has a pessary (last one did not fit)  Nl pap 05/2020 - nl with neg HPV  No new partners  Flu vaccine-declines  Tetanus vaccine 2016 Pneumovax- declines  Zoster vaccine- declines  Dexa-wants to schedule  Falls- none  Fractures-none  Supplements takes vit d  Exercise - balance exercises  She gets up and down without using her hands (repetitive)  Walking also    Advance directive: has up to date  Cognitive function addressed- see scanned forms- and if abnormal then additional documentation follows.   Mother had dementia  She has no concerns     PMH and SH reviewed  Meds, vitals, and allergies reviewed.   ROS: See HPI.  Otherwise negative.    Weight :  Wt Readings from Last 3 Encounters:   05/23/22 132 lb 2 oz (59.9 kg)  05/10/21 127 lb 8 oz (57.8 kg)  04/19/21 129 lb 9.6 oz (58.8 kg)   24.56 kg/m   Hearing/vision: Hearing Screening   '500Hz'$  '1000Hz'$  '2000Hz'$  '4000Hz'$   Right ear 40 40 40 40  Left ear 40 40 40 40   Vision Screening   Right eye Left eye Both eyes  Without correction     With correction '20/25 20/15 20/15 '$     PHQ:     05/23/2022   10:55 AM 05/10/2021    8:27 AM 05/08/2021    4:42 PM 04/19/2021    9:27 AM 05/08/2020   10:02 AM  Depression screen PHQ 2/9  Decreased Interest 0 0 0 0 0  Down, Depressed, Hopeless 0 0 0 0 0  PHQ - 2 Score 0 0 0 0 0  Altered sleeping     0  Tired, decreased energy     0  Change in appetite     0  Feeling bad or failure about yourself      0  Trouble concentrating     0  Moving slowly or fidgety/restless     0  Suicidal thoughts     0  PHQ-9 Score     0  Difficult doing work/chores     Not difficult at all     ADLs: no help needed   Functionality: excellent   Care team : no new specialists  Looking for new gyn , Dr Carollee Leitz- Dr Juliene Pina- Dr Carlean Purl    BP Readings from Last 3 Encounters:  05/23/22 130/81  05/10/21 122/70  04/19/21 119/75     Pulse Readings from Last 3 Encounters:  05/23/22 (!) 56  05/10/21 67  04/19/21 62    Hyperlipidemia Lab Results  Component Value Date   CHOL 207 (H) 05/10/2022   CHOL 205 (H) 05/03/2021   CHOL 224 (H) 05/02/2020   Lab Results  Component Value Date   HDL 57.50 05/10/2022   HDL 51.40 05/03/2021   HDL 62.70 05/02/2020   Lab Results  Component Value Date   LDLCALC 134 (H) 05/10/2022   LDLCALC 138 (H) 05/03/2021   LDLCALC 143 (H) 05/02/2020   Lab Results  Component Value Date   TRIG 77.0 05/10/2022   TRIG 77.0 05/03/2021   TRIG 93.0 05/02/2020   Lab Results  Component Value Date   CHOLHDL 4 05/10/2022   CHOLHDL 4 05/03/2021   CHOLHDL 4 05/02/2020   Lab Results  Component Value Date   LDLDIRECT 133.0 01/06/2013   Diet is good  Sweets  are weakness No red meat  Lost of veggies  No shellfish   No fatty bkfast food   Father had CAD in later life    The 10-year ASCVD risk score (Arnett DK, et al., 2019) is: 6.1%   Values used to calculate the score:     Age: 55 years     Sex: Female     Is Non-Hispanic African American: No     Diabetic: No     Tobacco smoker: No     Systolic Blood Pressure: 712 mmHg     Is BP treated: No     HDL Cholesterol: 57.5 mg/dL     Total Cholesterol: 207 mg/dL   Patient Active Problem List   Diagnosis Date Noted   Estrogen deficiency 05/23/2022   Welcome to Medicare preventive visit 05/09/2022   Insomnia 08/29/2020   Hyperkalemia 05/08/2020   Urticaria 05/08/2020   Screening mammogram, encounter for 05/01/2018   Dermatitis 10/22/2016   History of cholecystectomy 12/21/2013   Chronic buttock pain 01/10/2012   Routine general medical examination at a health care facility 01/02/2012   Colon cancer screening 11/27/2010   HEADACHE 07/06/2010   HYPERCHOLESTEROLEMIA 01/30/2010   Uterine prolapse 11/15/2008   MENOPAUSAL SYNDROME 45/80/9983   History of Helicobacter pylori infection 10/05/2007   GERD 10/05/2007   OVERACTIVE BLADDER 10/05/2007   FIBROCYSTIC BREAST DISEASE 10/05/2007   HYPERHIDROSIS 10/05/2007   Past Medical History:  Diagnosis Date   Anxiety    Carotid bruit 09/27/2007   carotid doppler- normal   Depression    Female bladder prolapse    GERD (gastroesophageal reflux disease)    Overactive bladder    Presence of pessary    Sleep disorder    Past Surgical History:  Procedure Laterality Date   BUNIONECTOMY     right foot   CHOLECYSTECTOMY     Social History   Tobacco Use   Smoking status: Never   Smokeless tobacco: Never  Vaping Use   Vaping Use: Never used  Substance Use Topics   Alcohol use: No    Alcohol/week: 0.0 standard drinks of alcohol   Drug use: No   Family History  Problem Relation Age of Onset   Arthritis Mother        Rheumatoid    Dementia Mother    Heart disease Father  CABG   Hyperlipidemia Father    Cancer Maternal Aunt        breast   Breast cancer Maternal Aunt    Allergies  Allergen Reactions   Codeine     REACTION: GI upset   Meloxicam    Sulfonamide Derivatives     REACTION: hives   Zolpidem Tartrate     REACTION: ? headache   Current Outpatient Medications on File Prior to Visit  Medication Sig Dispense Refill   Ascorbic Acid (VITAMIN C PO) Take by mouth.     clobetasol ointment (TEMOVATE) 6.76 % Apply 1 application topically 2 (two) times daily. 30 g 2   mometasone (ELOCON) 0.1 % cream APPLY TO AFFECTED AREAS EVERY DAY AS DIRECTED 45 g 2   Multiple Vitamins-Minerals (ZINC PO) Take by mouth.     Probiotic Product (PROBIOTIC DAILY PO) Take by mouth.     VITAMIN D PO Take by mouth.     No current facility-administered medications on file prior to visit.     Review of Systems  Constitutional:  Negative for activity change, appetite change, fatigue, fever and unexpected weight change.  HENT:  Negative for congestion, ear pain, rhinorrhea, sinus pressure and sore throat.   Eyes:  Negative for pain, redness and visual disturbance.  Respiratory:  Negative for cough, shortness of breath and wheezing.   Cardiovascular:  Negative for chest pain and palpitations.  Gastrointestinal:  Negative for abdominal pain, blood in stool, constipation and diarrhea.  Endocrine: Negative for polydipsia and polyuria.  Genitourinary:  Negative for dysuria, frequency and urgency.  Musculoskeletal:  Negative for arthralgias, back pain and myalgias.  Skin:  Negative for pallor and rash.  Allergic/Immunologic: Negative for environmental allergies.  Neurological:  Negative for dizziness, syncope and headaches.  Hematological:  Negative for adenopathy. Does not bruise/bleed easily.  Psychiatric/Behavioral:  Negative for decreased concentration and dysphoric mood. The patient is not nervous/anxious.         Objective:   Physical Exam Constitutional:      General: She is not in acute distress.    Appearance: Normal appearance. She is well-developed. She is not ill-appearing or diaphoretic.  HENT:     Head: Normocephalic and atraumatic.     Right Ear: Tympanic membrane, ear canal and external ear normal.     Left Ear: Tympanic membrane, ear canal and external ear normal.     Nose: Nose normal. No congestion.     Mouth/Throat:     Mouth: Mucous membranes are moist.     Pharynx: Oropharynx is clear. No posterior oropharyngeal erythema.  Eyes:     General: No scleral icterus.    Extraocular Movements: Extraocular movements intact.     Conjunctiva/sclera: Conjunctivae normal.     Pupils: Pupils are equal, round, and reactive to light.  Neck:     Thyroid: No thyromegaly.     Vascular: No carotid bruit or JVD.  Cardiovascular:     Rate and Rhythm: Normal rate and regular rhythm.     Pulses: Normal pulses.     Heart sounds: Normal heart sounds.     No gallop.  Pulmonary:     Effort: Pulmonary effort is normal. No respiratory distress.     Breath sounds: Normal breath sounds. No wheezing.     Comments: Good air exch Chest:     Chest wall: No tenderness.  Abdominal:     General: Bowel sounds are normal. There is no distension or abdominal bruit.  Palpations: Abdomen is soft. There is no mass.     Tenderness: There is no abdominal tenderness.     Hernia: No hernia is present.  Genitourinary:    Comments: Breast exam: No mass, nodules, thickening, tenderness, bulging, retraction, inflamation, nipple discharge or skin changes noted.  No axillary or clavicular LA.     Musculoskeletal:        General: No tenderness. Normal range of motion.     Cervical back: Normal range of motion and neck supple. No rigidity. No muscular tenderness.     Right lower leg: No edema.     Left lower leg: No edema.     Comments: No kyphosis   Lymphadenopathy:     Cervical: No cervical adenopathy.  Skin:     General: Skin is warm and dry.     Coloration: Skin is not pale.     Findings: No erythema or rash.     Comments: Solar lentigines diffusely   Neurological:     Mental Status: She is alert. Mental status is at baseline.     Cranial Nerves: No cranial nerve deficit.     Motor: No abnormal muscle tone.     Coordination: Coordination normal.     Gait: Gait normal.     Deep Tendon Reflexes: Reflexes are normal and symmetric. Reflexes normal.  Psychiatric:        Mood and Affect: Mood normal.        Cognition and Memory: Cognition and memory normal.           Assessment & Plan:   Problem List Items Addressed This Visit       Other   Colon cancer screening    Will be due for 10 y colonoscopy 03/2023 Pt is aware Will call for ref if she does not get a reminder       Estrogen deficiency   Relevant Orders   DG Bone Density   HYPERCHOLESTEROLEMIA    Disc goals for lipids and reasons to control them Rev last labs with pt Rev low sat fat diet in detail Stable, fair  Disc goal of LDL under 100 (now 134) Rev ASCVD risk score  Will work on diet  May consider statin later if not imp        Welcome to [employer] preventive visit - Primary    Reviewed health habits including diet and exercise and skin cancer prevention Reviewed appropriate screening tests for age  Also reviewed health mt list, fam hx and immunization status , as well as social and family history   See HPI Labs reviewed  Colonoscopy utd-due in aug 2024/pt aware Mammogram utd 02/2022 Pap utd  Declines flu , shingrix and pna vaccines (discussed) dexa ordered for [place] , no falls or fx, good exercise and vit D , enc ca if she tolerates Adv directive is up to date No cognitive concerns Nl hearing and vision screens phq score of 0 No help needed for ADLs, good functionality  Care team reviewed

## 2022-05-23 NOTE — Patient Instructions (Addendum)
Try to get 1200-1500 mg of calcium per day with at least 2000 iu of vitamin D - for bone health  Think about some more resistance training    Avoid red meat/ fried foods/ egg yolks/ fatty breakfast meats/ butter, cheese and high fat dairy/ and shellfish   Try some knee high support socks for the leg and ankle swelling and soreness If no improvement let us know    I ordered a bone density test at the [facility] breast center  Call to schedule it   Please call the location of your choice from the menu below to schedule your Mammogram and/or Bone Density appointment.    [facility]                      Phone:  [phone], [place] [address]                                                             Services: Traditional and 3D Mammogram, [facility]                 Phone: [phone] [address], [place] [address]    Service: Bone Density ONLY   *this site does NOT perform mammograms  [facility]                        Phone:  [phone] [address], [place] [address]                                            Services:  3D Mammogram and [place] at [facility]   Phone:  [phone]   [address] [place], [place] [address]  Services: 3D Mammogram and Bone Density  [place] at [facility])  Phone:  [phone]   [address] [place], [place] [address]                                              Services:  3D Mammogram and Bone  Density

## 2022-05-23 NOTE — Assessment & Plan Note (Signed)
Reviewed health habits including diet and exercise and skin cancer prevention Reviewed appropriate screening tests for age  Also reviewed health mt list, fam hx and immunization status , as well as social and family history   See HPI Labs reviewed  Colonoscopy utd-due in aug 2024/pt aware Mammogram utd 02/2022 Pap utd  Declines flu , shingrix and pna vaccines (discussed) dexa ordered for [place] , no falls or fx, good exercise and vit D , enc ca if she tolerates Adv directive is up to date No cognitive concerns Nl hearing and vision screens phq score of 0 No help needed for ADLs, good functionality  Care team reviewed

## 2022-05-23 NOTE — Assessment & Plan Note (Signed)
Will be due for 10 y colonoscopy 03/2023 Pt is aware Will call for ref if she does not get a reminder

## 2022-07-10 ENCOUNTER — Ambulatory Visit (INDEPENDENT_AMBULATORY_CARE_PROVIDER_SITE_OTHER): Payer: Medicare Other | Admitting: Dermatology

## 2022-07-10 DIAGNOSIS — L814 Other melanin hyperpigmentation: Secondary | ICD-10-CM | POA: Diagnosis not present

## 2022-07-10 DIAGNOSIS — D229 Melanocytic nevi, unspecified: Secondary | ICD-10-CM

## 2022-07-10 DIAGNOSIS — L821 Other seborrheic keratosis: Secondary | ICD-10-CM | POA: Diagnosis not present

## 2022-07-10 DIAGNOSIS — Z1283 Encounter for screening for malignant neoplasm of skin: Secondary | ICD-10-CM | POA: Diagnosis not present

## 2022-07-10 DIAGNOSIS — L578 Other skin changes due to chronic exposure to nonionizing radiation: Secondary | ICD-10-CM | POA: Diagnosis not present

## 2022-07-10 NOTE — Progress Notes (Signed)
   Follow-Up Visit   Subjective  Katherine Macdonald is a 66 y.o. female who presents for the following: Annual Exam (Mole check ). The patient presents for Total-Body Skin Exam (TBSE) for skin cancer screening and mole check.  The patient has spots, moles and lesions to be evaluated, some may be new or changing and the patient has concerns that these could be cancer.   The following portions of the chart were reviewed this encounter and updated as appropriate:   Tobacco  Allergies  Meds  Problems  Med Hx  Surg Hx  Fam Hx      Review of Systems:  No other skin or systemic complaints except as noted in HPI or Assessment and Plan.  Objective  Well appearing patient in no apparent distress; mood and affect are within normal limits.  A full examination was performed including scalp, head, eyes, ears, nose, lips, neck, chest, axillae, abdomen, back, buttocks, bilateral upper extremities, bilateral lower extremities, hands, feet, fingers, toes, fingernails, and toenails. All findings within normal limits unless otherwise noted below.  abdomen Stuck-on, waxy, tan-brown papule or plaque --Discussed benign etiology and prognosis.    Assessment & Plan  Seborrheic keratosis abdomen - Stuck-on, waxy, tan-brown papules and/or plaques  - Benign-appearing - Discussed benign etiology and prognosis. - Observe - Call for any changes  Lentigines - Scattered tan macules - Due to sun exposure - Benign-appearing, observe - Recommend daily broad spectrum sunscreen SPF 30+ to sun-exposed areas, reapply every 2 hours as needed. - Call for any changes  Seborrheic Keratoses - Stuck-on, waxy, tan-brown papules and/or plaques  - Benign-appearing - Discussed benign etiology and prognosis. - Observe - Call for any changes  Melanocytic Nevi - Tan-brown and/or pink-flesh-colored symmetric macules and papules - Benign appearing on exam today - Observation - Call clinic for new or changing moles -  Recommend daily use of broad spectrum spf 30+ sunscreen to sun-exposed areas.   Hemangiomas - Red papules - Discussed benign nature - Observe - Call for any changes  Actinic Damage - Chronic condition, secondary to cumulative UV/sun exposure - diffuse scaly erythematous macules with underlying dyspigmentation - Recommend daily broad spectrum sunscreen SPF 30+ to sun-exposed areas, reapply every 2 hours as needed.  - Staying in the shade or wearing long sleeves, sun glasses (UVA+UVB protection) and wide brim hats (4-inch brim around the entire circumference of the hat) are also recommended for sun protection.  - Call for new or changing lesions.  Skin cancer screening performed today.   Return in about 1 year (around 07/11/2023) for TBSE .  IMarye Round, CMA, am acting as scribe for Sarina Ser, MD .  Documentation: I have reviewed the above documentation for accuracy and completeness, and I agree with the above.  Sarina Ser, MD

## 2022-07-10 NOTE — Patient Instructions (Signed)
Due to recent changes in healthcare laws, you may see results of your pathology and/or laboratory studies on MyChart before the doctors have had a chance to review them. We understand that in some cases there may be results that are confusing or concerning to you. Please understand that not all results are received at the same time and often the doctors may need to interpret multiple results in order to provide you with the best plan of care or course of treatment. Therefore, we ask that you please give us 2 business days to thoroughly review all your results before contacting the office for clarification. Should we see a critical lab result, you will be contacted sooner.   If You Need Anything After Your Visit  If you have any questions or concerns for your doctor, please call our main line at [phone] and press option 4 to reach your doctor's medical assistant. If no one answers, please leave a voicemail as directed and we will return your call as soon as possible. Messages left after 4 pm will be answered the following business day.   You may also send us a message via MyChart. We typically respond to MyChart messages within 1-2 business days.  For prescription refills, please ask your pharmacy to contact our office. Our fax number is [phone].  If you have an urgent issue when the clinic is closed that cannot wait until the next business day, you can page your doctor at the number below.    Please note that while we do our best to be available for urgent issues outside of office hours, we are not available 24/7.   If you have an urgent issue and are unable to reach us, you may choose to seek medical care at your doctor's office, retail clinic, urgent care center, or emergency room.  If you have a medical emergency, please immediately call 911 or go to the emergency department.  Pager Numbers  - Dr. Kowalski: [phone]  - Dr. Moye: [phone]  - Dr. Stewart:  [phone]  In the event of inclement weather, please call our main line at [phone] for an update on the status of any delays or closures.  Dermatology Medication Tips: Please keep the boxes that topical medications come in in order to help keep track of the instructions about where and how to use these. Pharmacies typically print the medication instructions only on the boxes and not directly on the medication tubes.   If your medication is too expensive, please contact our office at [phone] option 4 or send us a message through MyChart.   We are unable to tell what your co-pay for medications will be in advance as this is different depending on your insurance coverage. However, we may be able to find a substitute medication at lower cost or fill out paperwork to get insurance to cover a needed medication.   If a prior authorization is required to get your medication covered by your insurance company, please allow us 1-2 business days to complete this process.  Drug prices often vary depending on where the prescription is filled and some pharmacies may offer cheaper prices.  The website www.goodrx.com contains coupons for medications through different pharmacies. The prices here do not account for what the cost may be with help from insurance (it may be cheaper with your insurance), but the website can give you the price if you did not use any insurance.  - You can print the associated coupon and take it with   your prescription to the pharmacy.  - You may also stop by our office during regular business hours and pick up a GoodRx coupon card.  - If you need your prescription sent electronically to a different pharmacy, notify our office through [facility] MyChart or by phone at [phone] option 4.     Si Usted Necesita Algo Despus de Su Visita  Tambin puede enviarnos un mensaje a travs de MyChart. Por lo general respondemos a los mensajes de MyChart en el transcurso de 1 a 2  das hbiles.  Para renovar recetas, por favor pida a su farmacia que se ponga en contacto con nuestra oficina. Nuestro nmero de fax es el [phone].  Si tiene un asunto urgente cuando la clnica est cerrada y que no puede esperar hasta el siguiente da hbil, puede llamar/localizar a su doctor(a) al nmero que aparece a continuacin.   Por favor, tenga en cuenta que aunque hacemos todo lo posible para estar disponibles para asuntos urgentes fuera del horario de oficina, no estamos disponibles las 24 horas del da, los 7 das de la semana.   Si tiene un problema urgente y no puede comunicarse con nosotros, puede optar por buscar atencin mdica  en el consultorio de su doctor(a), en una clnica privada, en un centro de atencin urgente o en una sala de emergencias.  Si tiene una emergencia mdica, por favor llame inmediatamente al 911 o vaya a la sala de emergencias.  Nmeros de bper  - Dr. Kowalski: [phone]  - Dra. Moye: [phone]  - Dra. Stewart: [phone]  En caso de inclemencias del tiempo, por favor llame a nuestra lnea principal al [phone] para una actualizacin sobre el estado de cualquier retraso o cierre.  Consejos para la medicacin en dermatologa: Por favor, guarde las cajas en las que vienen los medicamentos de uso tpico para ayudarle a seguir las instrucciones sobre dnde y cmo usarlos. Las farmacias generalmente imprimen las instrucciones del medicamento slo en las cajas y no directamente en los tubos del medicamento.   Si su medicamento es muy caro, por favor, pngase en contacto con nuestra oficina llamando al [phone] y presione la opcin 4 o envenos un mensaje a travs de MyChart.   No podemos decirle cul ser su copago por los medicamentos por adelantado ya que esto es diferente dependiendo de la cobertura de su seguro. Sin embargo, es posible que podamos encontrar un medicamento sustituto a menor costo o llenar un formulario para que el  seguro cubra el medicamento que se considera necesario.   Si se requiere una autorizacin previa para que su compaa de seguros cubra su medicamento, por favor permtanos de 1 a 2 das hbiles para completar este proceso.  Los precios de los medicamentos varan con frecuencia dependiendo del lugar de dnde se surte la receta y alguna farmacias pueden ofrecer precios ms baratos.  El sitio web www.goodrx.com tiene cupones para medicamentos de diferentes farmacias. Los precios aqu no tienen en cuenta lo que podra costar con la ayuda del seguro (puede ser ms barato con su seguro), pero el sitio web puede darle el precio si no utiliz ningn seguro.  - Puede imprimir el cupn correspondiente y llevarlo con su receta a la farmacia.  - Tambin puede pasar por nuestra oficina durante el horario de atencin regular y recoger una tarjeta de cupones de GoodRx.  - Si necesita que su receta se enve electrnicamente a una farmacia diferente, informe a nuestra oficina a travs de MyChart de [facility]   o por telfono llamando al [phone] y presione la opcin 4.  

## 2022-07-23 ENCOUNTER — Encounter: Payer: Self-pay | Admitting: Dermatology

## 2022-09-16 ENCOUNTER — Encounter: Payer: Self-pay | Admitting: Family Medicine

## 2022-09-16 ENCOUNTER — Ambulatory Visit
Admission: RE | Admit: 2022-09-16 | Discharge: 2022-09-16 | Disposition: A | Discharge status: Home / Self Care | Payer: Medicare Other | Source: Ambulatory Visit | Attending: Family Medicine | Admitting: Family Medicine

## 2022-09-16 DIAGNOSIS — E2839 Other primary ovarian failure: Secondary | ICD-10-CM | POA: Diagnosis not present

## 2022-09-16 DIAGNOSIS — M810 Age-related osteoporosis without current pathological fracture: Secondary | ICD-10-CM | POA: Insufficient documentation

## 2023-01-29 ENCOUNTER — Encounter: Payer: Self-pay | Admitting: Family Medicine

## 2023-01-29 ENCOUNTER — Ambulatory Visit (INDEPENDENT_AMBULATORY_CARE_PROVIDER_SITE_OTHER): Payer: Medicare Other | Admitting: Family Medicine

## 2023-01-29 VITALS — BP 115/74 | HR 83 | Wt 136.0 lb

## 2023-01-29 DIAGNOSIS — N8110 Cystocele, unspecified: Secondary | ICD-10-CM | POA: Diagnosis not present

## 2023-01-29 DIAGNOSIS — N318 Other neuromuscular dysfunction of bladder: Secondary | ICD-10-CM | POA: Diagnosis not present

## 2023-01-29 NOTE — Assessment & Plan Note (Addendum)
Referral to uro/gyn, knows it will be after 8/24. Continue her ring pessary for now.

## 2023-01-29 NOTE — Progress Notes (Signed)
CC: Pessary for long time, went to another practice and given a new pessary and was very painful, pessary still holds the bladder up but Pt is noticing more leakage, Wants to check to see if Dr.Pratt has anything better than the current pessary but not as painful as the one from [facility].

## 2023-01-29 NOTE — Progress Notes (Signed)
   Subjective:    Patient ID: Katherine Macdonald is a 67 y.o. female presenting with GYN   on 01/29/2023  HPI: Patient has a ring pessary she has used for years. Notes increasing urge incontinence lately, and relays some stress incontinence as well. She has a cystocele and rectocele. She has had a pessary with a knob and support and finds it hurts worse to get in and out and has not improved the leaking of urine. She has previously seen Urology about 12 years ago.  Review of Systems  Constitutional:  Negative for chills and fever.  Respiratory:  Negative for shortness of breath.   Cardiovascular:  Negative for chest pain.  Gastrointestinal:  Negative for abdominal pain, nausea and vomiting.  Genitourinary:  Negative for dysuria.  Skin:  Negative for rash.      Objective:    BP 115/74   Pulse 83   Wt 136 lb (61.7 kg)   LMP 08/26/2008   BMI 25.28 kg/m  Physical Exam Exam conducted with a chaperone present.  Constitutional:      General: She is not in acute distress.    Appearance: She is well-developed.  HENT:     Head: Normocephalic and atraumatic.  Eyes:     General: No scleral icterus. Cardiovascular:     Rate and Rhythm: Normal rate.  Pulmonary:     Effort: Pulmonary effort is normal.  Abdominal:     Palpations: Abdomen is soft.  Musculoskeletal:     Cervical back: Neck supple.  Skin:    General: Skin is warm and dry.  Neurological:     Mental Status: She is alert and oriented to person, place, and time.         Assessment & Plan:   Problem List Items Addressed This Visit       Unprioritized   OVERACTIVE BLADDER - Primary    With some stress as well. Offered meds, which she declined. Briefly discussed surgical options, which she is also not interested in.      Relevant Orders   Ambulatory referral to Urogynecology   Female cystocele    Referral to uro/gyn, knows it will be after 8/24. Continue her ring pessary for now.      Relevant Orders    Ambulatory referral to Urogynecology    Return if symptoms worsen or fail to improve.  Reva Bores, MD 01/29/2023 2:58 PM

## 2023-01-29 NOTE — Assessment & Plan Note (Signed)
With some stress as well. Offered meds, which she declined. Briefly discussed surgical options, which she is also not interested in.

## 2023-02-24 ENCOUNTER — Other Ambulatory Visit: Payer: Self-pay | Admitting: Family Medicine

## 2023-02-24 DIAGNOSIS — Z1231 Encounter for screening mammogram for malignant neoplasm of breast: Secondary | ICD-10-CM

## 2023-04-10 ENCOUNTER — Encounter (INDEPENDENT_AMBULATORY_CARE_PROVIDER_SITE_OTHER): Payer: Self-pay

## 2023-04-17 ENCOUNTER — Ambulatory Visit
Admission: RE | Admit: 2023-04-17 | Discharge: 2023-04-17 | Disposition: A | Discharge status: Home / Self Care | Payer: Medicare Other | Source: Ambulatory Visit | Attending: Family Medicine | Admitting: Family Medicine

## 2023-04-17 DIAGNOSIS — Z1231 Encounter for screening mammogram for malignant neoplasm of breast: Secondary | ICD-10-CM | POA: Insufficient documentation

## 2023-04-18 ENCOUNTER — Encounter: Payer: Self-pay | Admitting: Obstetrics and Gynecology

## 2023-04-18 ENCOUNTER — Ambulatory Visit: Payer: Medicare Other | Admitting: Obstetrics and Gynecology

## 2023-04-18 VITALS — BP 123/77 | HR 76 | Ht 61.5 in | Wt 135.0 lb

## 2023-04-18 DIAGNOSIS — N8110 Cystocele, unspecified: Secondary | ICD-10-CM

## 2023-04-18 DIAGNOSIS — N393 Stress incontinence (female) (male): Secondary | ICD-10-CM

## 2023-04-18 DIAGNOSIS — N816 Rectocele: Secondary | ICD-10-CM

## 2023-04-18 DIAGNOSIS — N3281 Overactive bladder: Secondary | ICD-10-CM | POA: Diagnosis not present

## 2023-04-18 DIAGNOSIS — R350 Frequency of micturition: Secondary | ICD-10-CM

## 2023-04-18 LAB — POCT URINALYSIS DIPSTICK
Bilirubin, UA: NEGATIVE
Blood, UA: NEGATIVE
Glucose, UA: NEGATIVE
Ketones, UA: NEGATIVE
Leukocytes, UA: NEGATIVE
Nitrite, UA: NEGATIVE
Protein, UA: NEGATIVE
Spec Grav, UA: >=1.03 — AB (ref 1.010–1.025)
Urobilinogen, UA: 0.2 E.U./dL
pH, UA: 6 (ref 5.0–8.0)

## 2023-04-18 NOTE — Patient Instructions (Signed)
We discussed the symptoms of overactive bladder (OAB), which include urinary urgency, urinary frequency, night-time urination, with or without urge incontinence.  We discussed management including behavioral therapy (decreasing bladder irritants by following a bladder diet, urge suppression strategies, timed voids, bladder retraining), physical therapy, medication; and for refractory cases posterior tibial nerve stimulation, sacral neuromodulation, and intravesical botulinum toxin injection.   For treatment of stress urinary incontinence, which is leakage with physical activity/movement/strainging/coughing, we discussed expectant management versus nonsurgical options versus surgery. Nonsurgical options include weight loss, physical therapy, as well as a pessary.  Surgical options include a midurethral sling, which is a synthetic mesh sling that acts like a hammock under the urethra to prevent leakage of urine, and transurethral injection of a bulking agent.   

## 2023-04-18 NOTE — Progress Notes (Signed)
[facility] New Patient Evaluation and Consultation  Referring Provider: Reva Bores, MD PCP: Judy Pimple, MD Date of Service: 04/18/2023  SUBJECTIVE Chief Complaint: New Patient (Initial Visit) Katherine Macdonald is a 67 y.o. female here for a consult for prolapse and another option for a pessary.)  History of Present Illness: Katherine Macdonald is a 67 y.o. White or Caucasian female seen in consultation at the request of Dr. Shawnie Pons for evaluation of incontinence.    Review of records significant for: Has cystocele and rectocele and is using a ring pessary for years. Has increasing incontinence.  Has tried incontinence dish but was uncomfortable.   Urinary Symptoms: Leaks urine with cough/ sneeze, laughing, exercise, lifting, and with a full bladder Leaks multiple time(s) per day. Will have larger loss of urine with urgency only if she holds for too long.  Pad use: 5-6 pads per day.   She is bothered by her UI symptoms. She tried a pessary with knob but changed it back to the ring pessary.  Has not done pelvic PT but has done her own at home  Day time voids 8-10.  Nocturia: 1-2 times per night to void. Voiding dysfunction: she empties her bladder well.  does not use a catheter to empty bladder.  When urinating, she feels a weak stream Drinks: 2 cups coffee, 2-3 glasses water per day, decaf iced tea (lipton) occaionally with a meal  UTIs:  0  UTI's in the last year.   Denies history of blood in urine and kidney or bladder stones  Pelvic Organ Prolapse Symptoms:                   Denies a feeling of a bulge the vaginal area- Has a ring pessary in place.  She removes the pessary every 3-4 weeks. Sometimes will notice some spotting with removal but no significant bleeding.  Overall has been happy with the pessary.   Bowel Symptom: Bowel movements: 1-2 time(s) per day Stool consistency: soft  Straining: no.  Splinting: no.  Incomplete evacuation: no.  She Admits  to accidental bowel leakage / fecal incontinence  Occurs: "not often"- a few times year  Consistency with leakage: liquid Bowel regimen: none Last colonoscopy: Date- 2014, negative. Has   Sexual Function Sexually active: not frequently  Pain with sex: No  Pelvic Pain Denies pelvic pain   Past Medical History:  Past Medical History:  Diagnosis Date   Anxiety    Carotid bruit 09/27/2007   carotid doppler- normal   Depression    Female bladder prolapse    GERD (gastroesophageal reflux disease)    Overactive bladder    Presence of pessary    Sleep disorder      Past Surgical History:   Past Surgical History:  Procedure Laterality Date   BUNIONECTOMY     right foot   CHOLECYSTECTOMY       Past OB/GYN History: OB History  Gravida Para Term Preterm AB Living  2 2 2     2   SAB IAB Ectopic Multiple Live Births          2    # Outcome Date GA Lbr Len/2nd Weight Sex Type Anes PTL Lv  2 Term 12/14/87   7 lb 12 oz (3.515 kg) F Vag-Spont   LIV  1 Term 11/02/81   7 lb 12 oz (3.515 kg) F Vag-Spont   LIV    Menopausal: Denies vaginal bleeding since menopause Last pap  smear was 06/21/20- negative.     Medications: She has a current medication list which includes the following prescription(s): ascorbic acid, clobetasol ointment, mometasone, multiple vitamins-minerals, probiotic product, trazodone, and vitamin d.   Allergies: Patient is allergic to codeine, meloxicam, sulfonamide derivatives, and zolpidem tartrate.   Social History:  Social History   Tobacco Use   Smoking status: Never   Smokeless tobacco: Never  Vaping Use   Vaping status: Never Used  Substance Use Topics   Alcohol use: No    Alcohol/week: 0.0 standard drinks of alcohol   Drug use: No    Relationship status: married She lives with her spouse.   She is not employed. Regular exercise: No History of abuse: No  Family History:   Family History  Problem Relation Age of Onset   Arthritis Mother         Rheumatoid   Dementia Mother    Heart disease Father        CABG   Hyperlipidemia Father    Cancer Maternal Aunt        breast   Breast cancer Maternal Aunt      Review of Systems: Review of Systems  Constitutional:  Negative for fever, malaise/fatigue and weight loss.  Respiratory:  Negative for cough, shortness of breath and wheezing.   Cardiovascular:  Negative for chest pain, palpitations and leg swelling.  Gastrointestinal:  Negative for abdominal pain and blood in stool.  Genitourinary:  Negative for dysuria.       + vaginal discharge  Musculoskeletal:  Negative for myalgias.  Skin:  Negative for rash.  Neurological:  Negative for dizziness and headaches.  Endo/Heme/Allergies:  Does not bruise/bleed easily.  Psychiatric/Behavioral:  Negative for depression. The patient is not nervous/anxious.      OBJECTIVE Physical Exam: Vitals:   04/18/23 1428  BP: 123/77  Pulse: 76  Weight: 135 lb (61.2 kg)  Height: 5' 1.5" (1.562 m)    Physical Exam Constitutional:      General: She is not in acute distress. Pulmonary:     Effort: Pulmonary effort is normal.  Abdominal:     General: There is no distension.     Palpations: Abdomen is soft.     Tenderness: There is no abdominal tenderness. There is no rebound.  Musculoskeletal:        General: No swelling. Normal range of motion.  Skin:    General: Skin is warm and dry.     Findings: No rash.  Neurological:     Mental Status: She is alert and oriented to person, place, and time.  Psychiatric:        Mood and Affect: Mood normal.        Behavior: Behavior normal.      GU / Detailed Urogynecologic Evaluation:  Pelvic Exam: Normal external female genitalia; Bartholin's and Skene's glands normal in appearance; urethral meatus normal in appearance, no urethral masses or discharge.   CST: negative  Pessary removed and cleaned. Speculum exam reveals normal vaginal mucosa with atrophy. Cervix normal appearance.  Uterus normal single, nontender. Adnexa no mass, fullness, tenderness.  Pessary replaced.    Pelvic floor strength II/V, puborectalis III/V external anal sphincter III/V  Pelvic floor musculature: Right levator non-tender, Right obturator non-tender, Left levator non-tender, Left obturator non-tender  POP-Q:   POP-Q  -0.5  Aa   -0.5                                           Ba  6.5                                              C   4.5                                            Gh  4.5                                            Pb  9                                            tvl   0                                            Ap  0                                            Bp  8.5                                              D      Rectal Exam:  Normal sphincter tone, moderate distal rectocele, enterocoele not present, no rectal masses, no sign of dyssynergia when asking the patient to bear down.  Post-Void Residual (PVR) by Bladder Scan: In order to evaluate bladder emptying, we discussed obtaining a postvoid residual and she agreed to this procedure.  Procedure: The ultrasound unit was placed on the patient's abdomen in the suprapubic region after the patient had voided. A PVR of 12 ml was obtained by bladder scan.  Laboratory Results: POC urine: negative   ASSESSMENT AND PLAN Ms. Donaho is a 67 y.o. with:  1. Urinary frequency   2. Overactive bladder   3. SUI (stress urinary incontinence, female)   4. Prolapse of anterior vaginal wall   5. Prolapse of posterior vaginal wall    OAB - We discussed the symptoms of overactive bladder (OAB), which include urinary urgency, urinary frequency, nocturia, with or without urge incontinence.  While we do not know the exact etiology of OAB, several treatment options exist. We discussed management including behavioral therapy (decreasing bladder irritants, urge suppression  strategies, timed voids, bladder retraining), physical therapy, medication; for refractory cases posterior tibial nerve stimulation, sacral neuromodulation, and intravesical botulinum toxin injection.  - She prefers to start with pelvic PT, referral placed to [facility] rehab  2. SUI - For treatment of stress urinary incontinence,  non-surgical options include expectant management, weight loss, physical therapy, as well as a pessary.  Surgical options include a midurethral sling, Burch urethropexy, and transurethral injection of a bulking agent. - She will start with pelvic PT.   3. Stage II anterior, Stage II posterior, Stage I apical prolapse - For treatment of pelvic organ prolapse, we discussed options for management including expectant management, conservative management, and surgical management, such as Kegels, a pessary, pelvic floor physical therapy, and specific surgical procedures. - She is happy with her #5 ring pessary at this time. Would like to replace the pessary as she has had this one for 10+ years and it is a bit worn. Will order the same size and inform her when we have the replacement.   4. Accidental Bowel Leakage:  - Treatment options include anti-diarrhea medication (loperamide/ Imodium OTC or prescription lomotil), fiber supplements, physical therapy, and possible sacral neuromodulation or surgery.   - She feels this is not happening often and we discussed starting with pelvic PT can help symptoms.  - She will discuss colonoscopy with her PCP at upcoming visit  Return for pessary    Marguerita Beards, MD

## 2023-05-06 ENCOUNTER — Other Ambulatory Visit: Payer: Self-pay | Admitting: Family Medicine

## 2023-05-06 NOTE — Telephone Encounter (Signed)
CPE is scheduled on 05/26/23,   Last filled on 05/23/22 #30 tabs/ 3 refills

## 2023-05-15 ENCOUNTER — Telehealth: Payer: Self-pay | Admitting: Family Medicine

## 2023-05-15 DIAGNOSIS — E7800 Pure hypercholesterolemia, unspecified: Secondary | ICD-10-CM

## 2023-05-15 DIAGNOSIS — K219 Gastro-esophageal reflux disease without esophagitis: Secondary | ICD-10-CM

## 2023-05-15 DIAGNOSIS — E875 Hyperkalemia: Secondary | ICD-10-CM

## 2023-05-15 DIAGNOSIS — M810 Age-related osteoporosis without current pathological fracture: Secondary | ICD-10-CM

## 2023-05-15 NOTE — Telephone Encounter (Signed)
Her insurance put a hard stop to TSH unfortunately so I could not order it  Not a problem in the past  We will discuss at her follow up and if she wants one in the future will need to sign release/pay for it  Just a heads up   I also tried to send this to the lab pool but epic told me that I am not signed into that pool?

## 2023-05-15 NOTE — Telephone Encounter (Signed)
-----   Message from Alvina Chou sent at 04/30/2023  3:35 PM EDT ----- Regarding: Lab orders for Fri, 9.20.24 Patient is scheduled for CPX labs, please order future labs, Thanks , Camelia Eng

## 2023-05-16 ENCOUNTER — Other Ambulatory Visit (INDEPENDENT_AMBULATORY_CARE_PROVIDER_SITE_OTHER): Payer: Medicare Other

## 2023-05-16 DIAGNOSIS — K219 Gastro-esophageal reflux disease without esophagitis: Secondary | ICD-10-CM

## 2023-05-16 DIAGNOSIS — M810 Age-related osteoporosis without current pathological fracture: Secondary | ICD-10-CM | POA: Diagnosis not present

## 2023-05-16 DIAGNOSIS — E7800 Pure hypercholesterolemia, unspecified: Secondary | ICD-10-CM

## 2023-05-16 DIAGNOSIS — E875 Hyperkalemia: Secondary | ICD-10-CM | POA: Diagnosis not present

## 2023-05-16 LAB — CBC WITH DIFFERENTIAL/PLATELET
Basophils Absolute: 0 10*3/uL (ref 0.0–0.1)
Basophils Relative: 1 % (ref 0.0–3.0)
Eosinophils Absolute: 0.2 10*3/uL (ref 0.0–0.7)
Eosinophils Relative: 5.2 % — ABNORMAL HIGH (ref 0.0–5.0)
HCT: 41.7 % (ref 36.0–46.0)
Hemoglobin: 13.6 g/dL (ref 12.0–15.0)
Lymphocytes Relative: 24 % (ref 12.0–46.0)
Lymphs Abs: 1 10*3/uL (ref 0.7–4.0)
MCHC: 32.7 g/dL (ref 30.0–36.0)
MCV: 91.9 fl (ref 78.0–100.0)
Monocytes Absolute: 0.3 10*3/uL (ref 0.1–1.0)
Monocytes Relative: 7.9 % (ref 3.0–12.0)
Neutro Abs: 2.7 10*3/uL (ref 1.4–7.7)
Neutrophils Relative %: 61.9 % (ref 43.0–77.0)
Platelets: 301 10*3/uL (ref 150.0–400.0)
RBC: 4.54 Mil/uL (ref 3.87–5.11)
RDW: 13.4 % (ref 11.5–15.5)
WBC: 4.3 10*3/uL (ref 4.0–10.5)

## 2023-05-16 LAB — LIPID PANEL
Cholesterol: 211 mg/dL — ABNORMAL HIGH (ref 0–200)
HDL: 54.8 mg/dL (ref >39.00)
LDL Cholesterol: 131 mg/dL — ABNORMAL HIGH (ref 0–99)
NonHDL: 156.09
Total CHOL/HDL Ratio: 4
Triglycerides: 124 mg/dL (ref 0.0–149.0)
VLDL: 24.8 mg/dL (ref 0.0–40.0)

## 2023-05-16 LAB — COMPREHENSIVE METABOLIC PANEL
ALT: 17 U/L (ref 0–35)
AST: 17 U/L (ref 0–37)
Albumin: 4.3 g/dL (ref 3.5–5.2)
Alkaline Phosphatase: 81 U/L (ref 39–117)
BUN: 20 mg/dL (ref 6–23)
CO2: 30 mEq/L (ref 19–32)
Calcium: 9.8 mg/dL (ref 8.4–10.5)
Chloride: 103 mEq/L (ref 96–112)
Creatinine, Ser: 0.97 mg/dL (ref 0.40–1.20)
GFR: 60.96 mL/min (ref >60.00)
Glucose, Bld: 95 mg/dL (ref 70–99)
Potassium: 5.3 mEq/L — ABNORMAL HIGH (ref 3.5–5.1)
Sodium: 140 mEq/L (ref 135–145)
Total Bilirubin: 0.6 mg/dL (ref 0.2–1.2)
Total Protein: 7.3 g/dL (ref 6.0–8.3)

## 2023-05-16 LAB — VITAMIN D 25 HYDROXY (VIT D DEFICIENCY, FRACTURES): VITD: 53.79 ng/mL (ref 30.00–100.00)

## 2023-05-20 ENCOUNTER — Other Ambulatory Visit: Payer: PRIVATE HEALTH INSURANCE

## 2023-05-26 ENCOUNTER — Encounter: Payer: Self-pay | Admitting: Obstetrics and Gynecology

## 2023-05-26 ENCOUNTER — Ambulatory Visit (INDEPENDENT_AMBULATORY_CARE_PROVIDER_SITE_OTHER): Payer: Medicare Other | Admitting: Family Medicine

## 2023-05-26 ENCOUNTER — Encounter: Payer: Self-pay | Admitting: Family Medicine

## 2023-05-26 VITALS — BP 118/68 | HR 69 | Temp 98.5°F | Ht 61.0 in | Wt 127.5 lb

## 2023-05-26 DIAGNOSIS — E7800 Pure hypercholesterolemia, unspecified: Secondary | ICD-10-CM

## 2023-05-26 DIAGNOSIS — N318 Other neuromuscular dysfunction of bladder: Secondary | ICD-10-CM

## 2023-05-26 DIAGNOSIS — M810 Age-related osteoporosis without current pathological fracture: Secondary | ICD-10-CM | POA: Diagnosis not present

## 2023-05-26 DIAGNOSIS — F5104 Psychophysiologic insomnia: Secondary | ICD-10-CM | POA: Diagnosis not present

## 2023-05-26 DIAGNOSIS — Z1211 Encounter for screening for malignant neoplasm of colon: Secondary | ICD-10-CM

## 2023-05-26 DIAGNOSIS — E875 Hyperkalemia: Secondary | ICD-10-CM | POA: Diagnosis not present

## 2023-05-26 MED ORDER — TRAZODONE HCL 50 MG PO TABS: 50.0000 mg | ORAL_TABLET | Freq: Every evening | ORAL | 1 refills | Status: DC | PRN

## 2023-05-26 NOTE — Assessment & Plan Note (Signed)
Interested in cologaurd instead of colonoscopy  Discussed pros/cons  Cologuard ordered  No history of polyps in past

## 2023-05-26 NOTE — Assessment & Plan Note (Signed)
Taking trazodone 50-100 mg at bedtime prn  Is helpful  Stressed importance of sleep hygiene and self care

## 2023-05-26 NOTE — Assessment & Plan Note (Signed)
Disc goals for lipids and reasons to control them Rev last labs with pt Rev low sat fat diet in detail  LDL stable in 130s Interested in cardiac ca score-would help determine whether to treat   Test ordered

## 2023-05-26 NOTE — Patient Instructions (Addendum)
I ordered cologuard kit  If you don't hear from the company in 1-2 weeks let us know   Keep walking  Add some strength training to your routine, this is important for bone and brain health and can reduce your risk of falls and help your body use insulin properly and regulate weight  Light weights, exercise bands , and internet videos are a good way to start  Yoga (chair or regular), machines , floor exercises or a gym with machines are also good options   Read about alendronate and let us know if you want to try it for osteoporosis    I ordered a cardiac calcium score  If you don't a call about that in 1-2 weeks let us know   We will continue to watch your potassium level

## 2023-05-26 NOTE — Assessment & Plan Note (Signed)
Sees uro gyn now

## 2023-05-26 NOTE — Progress Notes (Signed)
Subjective:    Patient ID: Katherine Macdonald, female    DOB: 1956/03/20, 67 y.o.   MRN: [id number]  HPI Pt presents for annual follow up of chronic health problems    Wt Readings from Last 3 Encounters:  05/26/23 127 lb 8 oz (57.8 kg)  04/18/23 135 lb (61.2 kg)  01/29/23 136 lb (61.7 kg)   24.09 kg/m  Vitals:   05/26/23 1419  BP: 118/68  Pulse: 69  Temp: 98.5 F (36.9 C)  SpO2: 94%    Immunization History  Administered Date(s) Administered   Influenza Whole 08/26/2005, 05/27/2007, 05/26/2008   Influenza-Unspecified 05/26/2013, 05/27/2014   Td 07/17/2005   Tdap 01/24/2015    Health Maintenance Due  Topic Date Due   Pneumonia Vaccine 34+ Years old (1 of 1 - PCV) Never done   Colonoscopy  03/27/2023   Medicare Annual Wellness (AWV)  05/24/2023   Pna vaccine - declines  Flu shot- declines  Shingrix - declines     Mammogram 03/2023  Self breast exam-no lumps   Gyn health Uro gyn -seeing currently    Colon cancer screening - 10 y colonoscopy was due 03/2023  No personal history of polyps  Wants to do cologuard    Bone health  Dexa  08/2022 osteoporosis  Falls- none  Fractures-none  Supplements  mvi (has D in it)  Last vitamin D Lab Results  Component Value Date   VD25OH 53.79 05/16/2023    Exercise - walks  3-4 times per week 2-3 miles  Needs to do more resistance training    Sees derm regularly  Dr Gwen Pounds   Mood    05/26/2023    2:23 PM 01/29/2023    3:24 PM 05/23/2022   10:55 AM 05/10/2021    8:27 AM 05/08/2021    4:42 PM  Depression screen PHQ 2/9  Decreased Interest 0 0 0 0 0  Down, Depressed, Hopeless 0 0 0 0 0  PHQ - 2 Score 0 0 0 0 0  Altered sleeping 0 0     Tired, decreased energy 0 0     Change in appetite 0 0     Feeling bad or failure about yourself  0 0     Trouble concentrating 0 0     Moving slowly or fidgety/restless 0 0     Suicidal thoughts 0 0     PHQ-9 Score 0 0     Difficult doing work/chores Not difficult at all  Not difficult at all     Takes trazodone for sleep   Hyperlipidemia Lab Results  Component Value Date   CHOL 211 (H) 05/16/2023   CHOL 207 (H) 05/10/2022   CHOL 205 (H) 05/03/2021   Lab Results  Component Value Date   HDL 54.80 05/16/2023   HDL 57.50 05/10/2022   HDL 51.40 05/03/2021   Lab Results  Component Value Date   LDLCALC 131 (H) 05/16/2023   LDLCALC 134 (H) 05/10/2022   LDLCALC 138 (H) 05/03/2021   Lab Results  Component Value Date   TRIG 124.0 05/16/2023   TRIG 77.0 05/10/2022   TRIG 77.0 05/03/2021   Lab Results  Component Value Date   CHOLHDL 4 05/16/2023   CHOLHDL 4 05/10/2022   CHOLHDL 4 05/03/2021   Lab Results  Component Value Date   LDLDIRECT 133.0 01/06/2013   Diet controlled  The 10-year ASCVD risk score (Arnett DK, et al., 2019) is: 5.5%   Values used to calculate the score:  Age: 89 years     Sex: Female     Is Non-Hispanic African American: No     Diabetic: No     Tobacco smoker: No     Systolic Blood Pressure: 118 mmHg     Is BP treated: No     HDL Cholesterol: 54.8 mg/dL     Total Cholesterol: 211 mg/dL     Lab Results  Component Value Date   NA 140 05/16/2023   K 5.3 No hemolysis seen (H) 05/16/2023   CO2 30 05/16/2023   GLUCOSE 95 05/16/2023   BUN 20 05/16/2023   CREATININE 0.97 05/16/2023   CALCIUM 9.8 05/16/2023   GFR 60.96 05/16/2023   GFRNONAA >60 10/17/2017  Tends to run slightly high for K  She looked at her medicines -no K that she could see  Does not take nsaids often (once every 3-4 mo)     Lab Results  Component Value Date   ALT 17 05/16/2023   AST 17 05/16/2023   ALKPHOS 81 05/16/2023   BILITOT 0.6 05/16/2023    Lab Results  Component Value Date   WBC 4.3 05/16/2023   HGB 13.6 05/16/2023   HCT 41.7 05/16/2023   MCV 91.9 05/16/2023   PLT 301.0 05/16/2023   Lab Results  Component Value Date   TSH 1.94 05/10/2022     Patient Active Problem List   Diagnosis Date Noted   Female cystocele  01/29/2023   Osteoporosis 09/16/2022   Estrogen deficiency 05/23/2022   Welcome to Medicare preventive visit 05/09/2022   Insomnia 08/29/2020   Hyperkalemia 05/08/2020   Urticaria 05/08/2020   Screening mammogram, encounter for 05/01/2018   Dermatitis 10/22/2016   History of cholecystectomy 12/21/2013   Chronic buttock pain 01/10/2012   Routine general medical examination at a health care facility 01/02/2012   Colon cancer screening 11/27/2010   HYPERCHOLESTEROLEMIA 01/30/2010   MENOPAUSAL SYNDROME 11/15/2008   History of Helicobacter pylori infection 10/05/2007   GERD 10/05/2007   OVERACTIVE BLADDER 10/05/2007   FIBROCYSTIC BREAST DISEASE 10/05/2007   HYPERHIDROSIS 10/05/2007   Past Medical History:  Diagnosis Date   Anxiety    Carotid bruit 09/27/2007   carotid doppler- normal   Depression    Female bladder prolapse    GERD (gastroesophageal reflux disease)    Overactive bladder    Presence of pessary    Sleep disorder    Past Surgical History:  Procedure Laterality Date   BUNIONECTOMY     right foot   CHOLECYSTECTOMY     Social History   Tobacco Use   Smoking status: Never   Smokeless tobacco: Never  Vaping Use   Vaping status: Never Used  Substance Use Topics   Alcohol use: No   Drug use: No   Family History  Problem Relation Age of Onset   Arthritis Mother        Rheumatoid   Dementia Mother    Heart disease Father        CABG   Hyperlipidemia Father    Cancer Maternal Aunt        breast   Breast cancer Maternal Aunt    Allergies  Allergen Reactions   Codeine     REACTION: GI upset   Meloxicam    Sulfonamide Derivatives     REACTION: hives   Zolpidem Tartrate     REACTION: ? headache   Current Outpatient Medications on File Prior to Visit  Medication Sig Dispense Refill   Ascorbic Acid (VITAMIN  C PO) Take by mouth.     clobetasol ointment (TEMOVATE) 0.05 % Apply 1 application topically 2 (two) times daily. 30 g 2   mometasone (ELOCON)  0.1 % cream APPLY TO AFFECTED AREAS EVERY DAY AS DIRECTED 45 g 2   Multiple Vitamins-Minerals (ZINC PO) Take by mouth.     Probiotic Product (PROBIOTIC DAILY PO) Take by mouth.     VITAMIN D PO Take by mouth.     No current facility-administered medications on file prior to visit.    Review of Systems  Constitutional:  Negative for activity change, appetite change, fatigue, fever and unexpected weight change.  HENT:  Negative for congestion, ear pain, rhinorrhea, sinus pressure and sore throat.   Eyes:  Negative for pain, redness and visual disturbance.  Respiratory:  Negative for cough, shortness of breath and wheezing.   Cardiovascular:  Negative for chest pain and palpitations.  Gastrointestinal:  Negative for abdominal pain, blood in stool, constipation and diarrhea.  Endocrine: Negative for polydipsia and polyuria.  Genitourinary:  Negative for dysuria, frequency and urgency.  Musculoskeletal:  Negative for arthralgias, back pain and myalgias.  Skin:  Negative for pallor and rash.  Allergic/Immunologic: Negative for environmental allergies.  Neurological:  Negative for dizziness, syncope and headaches.  Hematological:  Negative for adenopathy. Does not bruise/bleed easily.  Psychiatric/Behavioral:  Negative for decreased concentration and dysphoric mood. The patient is not nervous/anxious.        Objective:   Physical Exam Constitutional:      General: She is not in acute distress.    Appearance: Normal appearance. She is well-developed and normal weight. She is not ill-appearing or diaphoretic.  HENT:     Head: Normocephalic and atraumatic.     Right Ear: Tympanic membrane, ear canal and external ear normal.     Left Ear: Tympanic membrane, ear canal and external ear normal.     Nose: Nose normal. No congestion.     Mouth/Throat:     Mouth: Mucous membranes are moist.     Pharynx: Oropharynx is clear. No posterior oropharyngeal erythema.  Eyes:     General: No scleral  icterus.    Extraocular Movements: Extraocular movements intact.     Conjunctiva/sclera: Conjunctivae normal.     Pupils: Pupils are equal, round, and reactive to light.  Neck:     Thyroid: No thyromegaly.     Vascular: No carotid bruit or JVD.  Cardiovascular:     Rate and Rhythm: Normal rate and regular rhythm.     Pulses: Normal pulses.     Heart sounds: Normal heart sounds.     No gallop.  Pulmonary:     Effort: Pulmonary effort is normal. No respiratory distress.     Breath sounds: Normal breath sounds. No wheezing.     Comments: Good air exch Chest:     Chest wall: No tenderness.  Abdominal:     General: Bowel sounds are normal. There is no distension or abdominal bruit.     Palpations: Abdomen is soft. There is no mass.     Tenderness: There is no abdominal tenderness.     Hernia: No hernia is present.  Genitourinary:    Comments: Breast exam: No mass, nodules, thickening, tenderness, bulging, retraction, inflamation, nipple discharge or skin changes noted.  No axillary or clavicular LA.     Musculoskeletal:        General: No tenderness. Normal range of motion.     Cervical back: Normal range of motion  and neck supple. No rigidity. No muscular tenderness.     Right lower leg: No edema.     Left lower leg: No edema.     Comments: No kyphosis   Lymphadenopathy:     Cervical: No cervical adenopathy.  Skin:    General: Skin is warm and dry.     Coloration: Skin is not pale.     Findings: No erythema or rash.     Comments: Solar lentigines diffusely   Neurological:     Mental Status: She is alert. Mental status is at baseline.     Cranial Nerves: No cranial nerve deficit.     Motor: No abnormal muscle tone.     Coordination: Coordination normal.     Gait: Gait normal.     Deep Tendon Reflexes: Reflexes are normal and symmetric. Reflexes normal.  Psychiatric:        Mood and Affect: Mood normal.        Cognition and Memory: Cognition and memory normal.            Assessment & Plan:   Problem List Items Addressed This Visit       Musculoskeletal and Integument   Osteoporosis - Primary    Dexa 08/2022 No falls or fracture Discussed fall prevention, supplements and exercise for bone density  Handout given re: osteoporosis and also alendronate Pt will read it and let us know if she wants to start a 5 y course Discussed possible side effects  Discussed dental history / no current problems         Genitourinary   OVERACTIVE BLADDER    Sees uro gyn now         Other   Colon cancer screening    Interested in cologaurd instead of colonoscopy  Discussed pros/cons  Cologuard ordered  No history of polyps in past       Relevant Orders   Cologuard   HYPERCHOLESTEROLEMIA    Disc goals for lipids and reasons to control them Rev last labs with pt Rev low sat fat diet in detail  LDL stable in 130s Interested in cardiac ca score-would help determine whether to treat   Test ordered        Relevant Orders   CT CARDIAC SCORING (SELF PAY ONLY)   Hyperkalemia    This is mild and chronic  No K containing supplements  Diet is normal  Normal GFR/ renal numbers  Seldom to never takes nsaids   Will continue to monitor       Insomnia    Taking trazodone 50-100 mg at bedtime prn  Is helpful  Stressed importance of sleep hygiene and self care

## 2023-05-26 NOTE — Assessment & Plan Note (Signed)
This is mild and chronic  No K containing supplements  Diet is normal  Normal GFR/ renal numbers  Seldom to never takes nsaids   Will continue to monitor

## 2023-05-26 NOTE — Assessment & Plan Note (Signed)
Dexa 08/2022 No falls or fracture Discussed fall prevention, supplements and exercise for bone density  Handout given re: osteoporosis and also alendronate Pt will read it and let us know if she wants to start a 5 y course Discussed possible side effects  Discussed dental history / no current problems

## 2023-06-04 ENCOUNTER — Ambulatory Visit
Admission: RE | Admit: 2023-06-04 | Discharge: 2023-06-04 | Disposition: A | Discharge status: Home / Self Care | Payer: Medicare Other | Source: Ambulatory Visit | Attending: Family Medicine | Admitting: Family Medicine

## 2023-06-04 DIAGNOSIS — E7800 Pure hypercholesterolemia, unspecified: Secondary | ICD-10-CM | POA: Insufficient documentation

## 2023-06-07 LAB — COLOGUARD: COLOGUARD: NEGATIVE

## 2023-06-17 ENCOUNTER — Ambulatory Visit: Payer: Medicare Other

## 2023-07-07 ENCOUNTER — Encounter: Payer: Self-pay | Admitting: Internal Medicine

## 2023-07-16 ENCOUNTER — Ambulatory Visit (INDEPENDENT_AMBULATORY_CARE_PROVIDER_SITE_OTHER): Payer: Medicare Other | Admitting: Dermatology

## 2023-07-16 DIAGNOSIS — L578 Other skin changes due to chronic exposure to nonionizing radiation: Secondary | ICD-10-CM | POA: Diagnosis not present

## 2023-07-16 DIAGNOSIS — L918 Other hypertrophic disorders of the skin: Secondary | ICD-10-CM

## 2023-07-16 DIAGNOSIS — D229 Melanocytic nevi, unspecified: Secondary | ICD-10-CM

## 2023-07-16 DIAGNOSIS — L814 Other melanin hyperpigmentation: Secondary | ICD-10-CM | POA: Diagnosis not present

## 2023-07-16 DIAGNOSIS — Z1283 Encounter for screening for malignant neoplasm of skin: Secondary | ICD-10-CM | POA: Diagnosis not present

## 2023-07-16 DIAGNOSIS — Z808 Family history of malignant neoplasm of other organs or systems: Secondary | ICD-10-CM

## 2023-07-16 NOTE — Progress Notes (Signed)
   Follow-Up Visit   Subjective  Katherine Macdonald is a 67 y.o. female who presents for the following: Skin Cancer Screening and Full Body Skin Exam  The patient presents for Total-Body Skin Exam (TBSE) for skin cancer screening and mole check. The patient has spots, moles and lesions to be evaluated, some may be new or changing and the patient may have concern these could be cancer.    The following portions of the chart were reviewed this encounter and updated as appropriate: medications, allergies, medical history  Review of Systems:  No other skin or systemic complaints except as noted in HPI or Assessment and Plan.  Objective  Well appearing patient in no apparent distress; mood and affect are within normal limits.  A full examination was performed including scalp, head, eyes, ears, nose, lips, neck, chest, axillae, abdomen, back, buttocks, bilateral upper extremities, bilateral lower extremities, hands, feet, fingers, toes, fingernails, and toenails. All findings within normal limits unless otherwise noted below.   Relevant physical exam findings are noted in the Assessment and Plan.    Assessment & Plan   SKIN CANCER SCREENING PERFORMED TODAY.  ACTINIC DAMAGE - Chronic condition, secondary to cumulative UV/sun exposure - diffuse scaly erythematous macules with underlying dyspigmentation - Recommend daily broad spectrum sunscreen SPF 30+ to sun-exposed areas, reapply every 2 hours as needed.  - Staying in the shade or wearing long sleeves, sun glasses (UVA+UVB protection) and wide brim hats (4-inch brim around the entire circumference of the hat) are also recommended for sun protection.  - Call for new or changing lesions.  LENTIGINES, SEBORRHEIC KERATOSES, HEMANGIOMAS - Benign normal skin lesions - Benign-appearing - Call for any changes  MELANOCYTIC NEVI - Tan-brown and/or pink-flesh-colored symmetric macules and papules - Benign appearing on exam today -  Observation - Call clinic for new or changing moles - Recommend daily use of broad spectrum spf 30+ sunscreen to sun-exposed areas.   FAMILY HISTORY OF SKIN CANCER What type(s):melanoma daughter, not sure what father had Who affected:daughter, father  Acrochordons (Skin Tags) - Fleshy, skin-colored pedunculated papules - Benign appearing.  - Observe. - If desired, they can be removed with an in office procedure that is not covered by insurance. - Please call the clinic if you notice any new or changing lesions.   Return for 1-2 yrs TBSE.  I, Ardis Rowan, RMA, am acting as scribe for Armida Sans, MD .   Documentation: I have reviewed the above documentation for accuracy and completeness, and I agree with the above.  Armida Sans, MD

## 2023-07-16 NOTE — Patient Instructions (Signed)

## 2023-07-20 ENCOUNTER — Encounter: Payer: Self-pay | Admitting: Dermatology

## 2023-08-07 ENCOUNTER — Ambulatory Visit: Payer: Medicare Other | Admitting: Obstetrics and Gynecology

## 2023-08-07 ENCOUNTER — Encounter: Payer: Self-pay | Admitting: Obstetrics and Gynecology

## 2023-08-07 VITALS — BP 118/74 | HR 75

## 2023-08-07 DIAGNOSIS — N812 Incomplete uterovaginal prolapse: Secondary | ICD-10-CM | POA: Diagnosis not present

## 2023-08-07 DIAGNOSIS — N8110 Cystocele, unspecified: Secondary | ICD-10-CM

## 2023-08-07 DIAGNOSIS — N816 Rectocele: Secondary | ICD-10-CM

## 2023-08-07 NOTE — Progress Notes (Signed)
[facility]   Subjective:     Chief Complaint: Pessary replacement Katherine Macdonald is a 67 y.o. female is here for replacement pessary.)  History of Present Illness: Katherine Macdonald is a 67 y.o. female with stage II pelvic organ prolapse who presents today for a pessary fitting.    Past Medical History: Patient  has a past medical history of Anxiety, Carotid bruit (09/27/2007), Depression, Female bladder prolapse, GERD (gastroesophageal reflux disease), Overactive bladder, Presence of pessary, and Sleep disorder.   Past Surgical History: She  has a past surgical history that includes Bunionectomy and Cholecystectomy.   Medications: She has a current medication list which includes the following prescription(s): ascorbic acid, clobetasol ointment, mometasone, multiple vitamins-minerals, probiotic product, trazodone, and vitamin d.   Allergies: Patient is allergic to codeine, meloxicam, sulfonamide derivatives, and zolpidem tartrate.   Social History: Patient  reports that she has never smoked. She has never used smokeless tobacco. She reports that she does not drink alcohol and does not use drugs.      Objective:    BP 118/74   Pulse 75   LMP 08/26/2008  Gen: No apparent distress, A&O x 3. Pelvic Exam: Normal external female genitalia; Bartholin's and Skene's glands normal in appearance; urethral meatus normal in appearance, no urethral masses or discharge.   A size #5 ring with support pessary was fitted. It was comfortable, stayed in place with valsalva and was an appropriate size. Patient was able to urinate, place pessary and remove pessary as she typically self manages this.    Assessment/Plan:    Assessment: Katherine Macdonald is a 67 y.o. with stage II pelvic organ prolapse who presents for a pessary fitting. Plan: She was fitted with a #5 ring with support pessary. She will remove at least once a week . She will use lubricant.   Follow-up in 6 months  for a pessary check or sooner as needed.  All questions were answered.    Selmer Dominion, NP

## 2023-08-26 ENCOUNTER — Encounter: Payer: Self-pay | Admitting: Obstetrics & Gynecology

## 2023-08-26 ENCOUNTER — Ambulatory Visit (INDEPENDENT_AMBULATORY_CARE_PROVIDER_SITE_OTHER): Payer: Medicare Other | Admitting: Obstetrics & Gynecology

## 2023-08-26 VITALS — BP 151/81 | HR 88

## 2023-08-26 DIAGNOSIS — N9089 Other specified noninflammatory disorders of vulva and perineum: Secondary | ICD-10-CM | POA: Diagnosis not present

## 2023-08-26 MED ORDER — VALACYCLOVIR HCL 1 G PO TABS: 1000.0000 mg | ORAL_TABLET | Freq: Three times a day (TID) | ORAL | 3 refills | Status: DC

## 2023-08-26 MED ORDER — LIDOCAINE 5 % EX OINT: 1.0000 | TOPICAL_OINTMENT | CUTANEOUS | 0 refills | Status: DC | PRN

## 2023-08-26 NOTE — Progress Notes (Signed)
 GYNECOLOGY OFFICE VISIT NOTE  History:   Katherine Macdonald is a 67 y.o. [phone] here today for evaluation of bumps noted on vulva area x 6 days and buttocks.  The bumps on inferior region are tender.  Never had anything like this. Able to urinate without difficulty. No new sexual contacts or contact with any new detergents/clothes/perfumes/lotions. She denies any abnormal vaginal discharge, bleeding, pelvic pain or other concerns.    Past Medical History:  Diagnosis Date   Anxiety    Carotid bruit 09/27/2007   carotid doppler- normal   Depression    Female bladder prolapse    GERD (gastroesophageal reflux disease)    Overactive bladder    Presence of pessary    Sleep disorder     Past Surgical History:  Procedure Laterality Date   BUNIONECTOMY     right foot   CHOLECYSTECTOMY      The following portions of the patient's history were reviewed and updated as appropriate: allergies, current medications, past family history, past medical history, past social history, past surgical history and problem list.   Health Maintenance:  Normal pap and negative HRHPV on 06/21/2020.  Normal mammogram on 04/17/2023.   Review of Systems:  Pertinent items noted in HPI and remainder of comprehensive ROS otherwise negative.  Physical Exam:  BP (!) 151/81   Pulse 88   LMP 08/26/2008  CONSTITUTIONAL: Well-developed, well-nourished female in no acute distress.  HEENT:  Normocephalic, atraumatic. External right and left ear normal. No scleral icterus.  NECK: Normal range of motion, supple, no masses noted on observation SKIN: No rash noted. Not diaphoretic. No erythema. No pallor. MUSCULOSKELETAL: Normal range of motion. No edema noted. NEUROLOGIC: Alert and oriented to person, place, and time. Normal muscle tone coordination. No cranial nerve deficit noted. PSYCHIATRIC: Normal mood and affect. Normal behavior. Normal judgment and thought content. CARDIOVASCULAR: Normal heart rate  noted RESPIRATORY: Effort and breath sounds normal, no problems with respiration noted ABDOMEN: No masses noted. No other overt distention noted.   PELVIC: Multiple vesicular and erythematous lesions around left labium majus/left vulva extending from top of labium majus to perianal area.  Some lesions are unroofed, mild tenderness elicited on inferior labium. Isolated lesion and edema of the mid point of the left labium minus. Normal right external genitalia,  normal urethral meatus; normal appearing distal vaginal mucosa with mild atrophy. No abnormal discharge noted.  Performed in the presence of a chaperone. See pictures below.        Assessment and Plan:    1. Vulvar lesions (Primary) The lesions are in one area, could correspond to a dermatome.  Not very tender.   HSV culture obtained given concern about HSV. Also concerned about possible vulvar shingles (history of varicella, did not get Shingles vaccine).  Will check Zoster antibodies/PCR, also will check RPR.  Will follow up results and manage accordingly. Valtrex  prescribed presumptively for shingles vs HSV outbreak, lidocaine  ointment also prescribed. Will monitor closely. - Herpes simplex virus culture - Varicella Zoster Abs, IgG/IgM - Varicella-zoster by PCR - RPR - valACYclovir  (VALTREX ) 1000 MG tablet; Take 1 tablet (1,000 mg total) by mouth 3 (three) times daily. Take for ten days.  Dispense: 30 tablet; Refill: 3 - lidocaine  (XYLOCAINE ) 5 % ointment; Apply 1 Application topically as needed.  Dispense: 35.44 g; Refill: 0      I spent 30 minutes dedicated to the care of this patient including pre-visit review of records, face to face time with the patient  discussing her conditions and treatments and post visit orders.    GLORIS HUGGER, MD, FACOG Obstetrician & Gynecologist, [facility] for [employer], [facility]

## 2023-08-28 LAB — RPR: RPR Ser Ql: REACTIVE — AB

## 2023-08-28 LAB — VARICELLA ZOSTER ABS, IGG/IGM
Varicella IgM: <0.91 {index} (ref 0.00–0.90)
Varicella zoster IgG: REACTIVE

## 2023-08-28 LAB — RPR, QUANT+TP ABS (REFLEX)
Rapid Plasma Reagin, Quant: 1:1 {titer} — ABNORMAL HIGH
T Pallidum Abs: NONREACTIVE

## 2023-08-28 LAB — VARICELLA-ZOSTER BY PCR: Varicella-Zoster, PCR: POSITIVE — AB

## 2023-08-28 LAB — HERPES SIMPLEX VIRUS CULTURE

## 2024-05-06 ENCOUNTER — Other Ambulatory Visit: Payer: Self-pay | Admitting: Family Medicine

## 2024-05-06 DIAGNOSIS — Z1231 Encounter for screening mammogram for malignant neoplasm of breast: Secondary | ICD-10-CM

## 2024-05-23 ENCOUNTER — Telehealth: Payer: Self-pay | Admitting: Family Medicine

## 2024-05-23 DIAGNOSIS — M810 Age-related osteoporosis without current pathological fracture: Secondary | ICD-10-CM

## 2024-05-23 DIAGNOSIS — E7800 Pure hypercholesterolemia, unspecified: Secondary | ICD-10-CM

## 2024-05-23 NOTE — Telephone Encounter (Signed)
-----   Message from Veva JINNY Ferrari sent at 05/10/2024  3:46 PM EDT ----- Regarding: Lab orders for Tue, 9.30.25 Patient is scheduled for CPX labs, please order future labs, Thanks , Veva

## 2024-05-24 ENCOUNTER — Ambulatory Visit
Admission: RE | Admit: 2024-05-24 | Discharge: 2024-05-24 | Disposition: A | Discharge status: Home / Self Care | Source: Ambulatory Visit | Attending: Family Medicine | Admitting: Family Medicine

## 2024-05-24 DIAGNOSIS — Z1231 Encounter for screening mammogram for malignant neoplasm of breast: Secondary | ICD-10-CM | POA: Diagnosis present

## 2024-05-25 ENCOUNTER — Other Ambulatory Visit: Payer: Medicare Other

## 2024-05-26 ENCOUNTER — Ambulatory Visit: Payer: Self-pay | Admitting: Family Medicine

## 2024-05-26 ENCOUNTER — Ambulatory Visit: Admitting: Family Medicine

## 2024-05-26 ENCOUNTER — Other Ambulatory Visit (INDEPENDENT_AMBULATORY_CARE_PROVIDER_SITE_OTHER)

## 2024-05-26 DIAGNOSIS — E7800 Pure hypercholesterolemia, unspecified: Secondary | ICD-10-CM | POA: Diagnosis not present

## 2024-05-26 DIAGNOSIS — M810 Age-related osteoporosis without current pathological fracture: Secondary | ICD-10-CM

## 2024-05-26 LAB — COMPREHENSIVE METABOLIC PANEL WITH GFR
ALT: 16 U/L (ref 0–35)
AST: 19 U/L (ref 0–37)
Albumin: 4.3 g/dL (ref 3.5–5.2)
Alkaline Phosphatase: 70 U/L (ref 39–117)
BUN: 16 mg/dL (ref 6–23)
CO2: 29 meq/L (ref 19–32)
Calcium: 9.6 mg/dL (ref 8.4–10.5)
Chloride: 104 meq/L (ref 96–112)
Creatinine, Ser: 1 mg/dL (ref 0.40–1.20)
GFR: 58.35 mL/min — ABNORMAL LOW (ref >60.00)
Glucose, Bld: 87 mg/dL (ref 70–99)
Potassium: 4.5 meq/L (ref 3.5–5.1)
Sodium: 139 meq/L (ref 135–145)
Total Bilirubin: 0.7 mg/dL (ref 0.2–1.2)
Total Protein: 7.2 g/dL (ref 6.0–8.3)

## 2024-05-26 LAB — LIPID PANEL
Cholesterol: 214 mg/dL — ABNORMAL HIGH (ref 0–200)
HDL: 57.7 mg/dL (ref >39.00)
LDL Cholesterol: 136 mg/dL — ABNORMAL HIGH (ref 0–99)
NonHDL: 156.31
Total CHOL/HDL Ratio: 4
Triglycerides: 101 mg/dL (ref 0.0–149.0)
VLDL: 20.2 mg/dL (ref 0.0–40.0)

## 2024-05-26 LAB — VITAMIN D 25 HYDROXY (VIT D DEFICIENCY, FRACTURES): VITD: 40.22 ng/mL (ref 30.00–100.00)

## 2024-05-26 LAB — TSH: TSH: 2.53 u[IU]/mL (ref 0.35–5.50)

## 2024-05-27 ENCOUNTER — Ambulatory Visit: Payer: Self-pay | Admitting: Family Medicine

## 2024-06-01 ENCOUNTER — Encounter: Payer: Self-pay | Admitting: Family Medicine

## 2024-06-01 ENCOUNTER — Ambulatory Visit: Payer: Medicare Other | Admitting: Family Medicine

## 2024-06-01 VITALS — BP 118/76 | HR 66 | Temp 98.3°F | Ht 61.0 in | Wt 135.4 lb

## 2024-06-01 DIAGNOSIS — M810 Age-related osteoporosis without current pathological fracture: Secondary | ICD-10-CM

## 2024-06-01 DIAGNOSIS — Z1211 Encounter for screening for malignant neoplasm of colon: Secondary | ICD-10-CM | POA: Diagnosis not present

## 2024-06-01 DIAGNOSIS — F5104 Psychophysiologic insomnia: Secondary | ICD-10-CM

## 2024-06-01 DIAGNOSIS — E875 Hyperkalemia: Secondary | ICD-10-CM

## 2024-06-01 DIAGNOSIS — E7800 Pure hypercholesterolemia, unspecified: Secondary | ICD-10-CM

## 2024-06-01 DIAGNOSIS — R944 Abnormal results of kidney function studies: Secondary | ICD-10-CM | POA: Insufficient documentation

## 2024-06-01 MED ORDER — TRAZODONE HCL 50 MG PO TABS: 50.0000 mg | ORAL_TABLET | Freq: Every evening | ORAL | 1 refills | Status: AC | PRN

## 2024-06-01 NOTE — Assessment & Plan Note (Signed)
 Normal K today  Lab Results  Component Value Date   K 4.5 05/26/2024

## 2024-06-01 NOTE — Assessment & Plan Note (Signed)
 Disc goals for lipids and reasons to control them Rev last labs with pt Rev low sat fat diet in detail Stable with LDL 136 and ratio of 4 Ascvd risk 6% Cardiac ca score 0  Continue to monitor  Good diet

## 2024-06-01 NOTE — Assessment & Plan Note (Deleted)
 Reviewed health habits including diet and exercise and skin cancer prevention Reviewed appropriate screening tests for age  Also reviewed health mt list, fam hx and immunization status , as well as social and family history   See HPI Labs reviewed and ordered Health Maintenance  Topic Date Due   Hepatitis C Screening  Never done   Pneumococcal Vaccine for age over 1 (1 of 1 - PCV) Never done   Medicare Annual Wellness Visit  05/24/2023   COVID-19 Vaccine (1) 06/10/2024*   Flu Shot  11/23/2024*   Zoster (Shingles) Vaccine (1 of 2) 09/01/2025*   DTaP/Tdap/Td vaccine (3 - Td or Tdap) 01/23/2025   Breast Cancer Screening  05/24/2025   Cologuard (Stool DNA test)  06/01/2026   DEXA scan (bone density measurement)  Completed   Meningitis B Vaccine  Aged Out   Colon Cancer Screening  Discontinued  *Topic was postponed. The date shown is not the original due date.    Declines flu , pna and covid vaccines Declines shingrix (had zoster in December) Cologuard neg 05/2023  Discussed fall prevention, supplements and exercise for bone density   Dexa due in feb 2026 PHQ 0

## 2024-06-01 NOTE — Assessment & Plan Note (Signed)
 Trazodone  prn  Does not use every day  Helpful

## 2024-06-01 NOTE — Assessment & Plan Note (Signed)
 Dexa 08/2022- due this feb/ will call for order No falls or fractures  Last vitamin D  Lab Results  Component Value Date   VD25OH 40.22 05/26/2024   Great exercise -commended Declines pharm therapy at this time (did review info on alendronate) Fam history of osteoporosis in mother   Discussed fall prevention, supplements and exercise for bone density

## 2024-06-01 NOTE — Progress Notes (Signed)
 Subjective:    Patient ID: Katherine Macdonald, female    DOB: June 02, 1956, 68 y.o.   MRN: [id number]  HPI  Here for annual review of chronic health problems   Wt Readings from Last 3 Encounters:  06/01/24 135 lb 6 oz (61.4 kg)  05/26/23 127 lb 8 oz (57.8 kg)  04/18/23 135 lb (61.2 kg)   25.58 kg/m  Vitals:   06/01/24 0903  BP: 118/76  Pulse: 66  Temp: 98.3 F (36.8 C)  SpO2: 99%    Immunization History  Administered Date(s) Administered   Influenza Whole 08/26/2005, 05/27/2007, 05/26/2008   Influenza-Unspecified 05/26/2013, 05/27/2014   Td 07/17/2005   Tdap 01/24/2015    Health Maintenance Due  Topic Date Due   Hepatitis C Screening  Never done   Pneumococcal Vaccine: 50+ Years (1 of 1 - PCV) Never done   Medicare Annual Wellness (AWV)  05/24/2023   Feels good  Takes care of herself    Declines flu shot  Shingrix (had shingles in December)  Pna vaccine - declines   Mammogram 04/2024  Self breast exam- no lumps on self exam   Gyn health Did see gyn for vulvar lesions  Had testing and tested positive for zoster  (neg for other stds) Neg Herpes simplex   No new issues   Colon cancer screening  Colonoscopy 03/2013  Cologuard neg 05/2023 No stool changes     Bone health  Dexa 08/2022 osteoporosis Pt decided against taking alendronate (perfers not to)  Falls-none  Fractures-none  Supplements ca and D  Last vitamin D  Lab Results  Component Value Date   VD25OH 40.22 05/26/2024    Exercise  Gym and trainer -likes it so far   Did gain some weight   Portion sizes are smaller  Does eat pretty healthy    Mood    06/01/2024    9:10 AM 05/26/2023    2:23 PM 01/29/2023    3:24 PM 05/23/2022   10:55 AM 05/10/2021    8:27 AM  Depression screen PHQ 2/9  Decreased Interest 0 0 0 0 0  Down, Depressed, Hopeless 0 0 0 0 0  PHQ - 2 Score 0 0 0 0 0  Altered sleeping 0 0 0    Tired, decreased energy 0 0 0    Change in appetite 0 0 0    Feeling bad or  failure about yourself  0 0 0    Trouble concentrating 0 0 0    Moving slowly or fidgety/restless 0 0 0    Suicidal thoughts 0 0 0    PHQ-9 Score 0 0 0    Difficult doing work/chores Not difficult at all Not difficult at all Not difficult at all     Trazodone  for sleep     Hyperlipidemia Lab Results  Component Value Date   CHOL 214 (H) 05/26/2024   CHOL 211 (H) 05/16/2023   CHOL 207 (H) 05/10/2022   Lab Results  Component Value Date   HDL 57.70 05/26/2024   HDL 54.80 05/16/2023   HDL 57.50 05/10/2022   Lab Results  Component Value Date   LDLCALC 136 (H) 05/26/2024   LDLCALC 131 (H) 05/16/2023   LDLCALC 134 (H) 05/10/2022   Lab Results  Component Value Date   TRIG 101.0 05/26/2024   TRIG 124.0 05/16/2023   TRIG 77.0 05/10/2022   Lab Results  Component Value Date   CHOLHDL 4 05/26/2024   CHOLHDL 4 05/16/2023   CHOLHDL 4  05/10/2022   Lab Results  Component Value Date   LDLDIRECT 133.0 01/06/2013   The 10-year ASCVD risk score (Arnett DK, et al., 2019) is: 6%   Values used to calculate the score:     Age: 67 years     Clincally relevant sex: Female     Is Non-Hispanic African American: No     Diabetic: No     Tobacco smoker: No     Systolic Blood Pressure: 118 mmHg     Is BP treated: No     HDL Cholesterol: 57.7 mg/dL     Total Cholesterol: 214 mg/dL  Cardiac calcium is 0    Chronic hyperkalemia  Lab Results  Component Value Date   NA 139 05/26/2024   K 4.5 05/26/2024   CO2 29 05/26/2024   GLUCOSE 87 05/26/2024   BUN 16 05/26/2024   CREATININE 1.00 05/26/2024   CALCIUM 9.6 05/26/2024   GFR 58.35 (L) 05/26/2024   GFRNONAA >60 10/17/2017   Lab Results  Component Value Date   ALT 16 05/26/2024   AST 19 05/26/2024   ALKPHOS 70 05/26/2024   BILITOT 0.7 05/26/2024    Lab Results  Component Value Date   TSH 2.53 05/26/2024      Patient Active Problem List   Diagnosis Date Noted   Decreased GFR 06/01/2024   Female cystocele 01/29/2023    Osteoporosis 09/16/2022   Estrogen deficiency 05/23/2022   Insomnia 08/29/2020   Hyperkalemia 05/08/2020   Screening mammogram, encounter for 05/01/2018   History of cholecystectomy 12/21/2013   Chronic buttock pain 01/10/2012   Routine general medical examination at a health care facility 01/02/2012   Colon cancer screening 11/27/2010   HYPERCHOLESTEROLEMIA 01/30/2010   MENOPAUSAL SYNDROME 11/15/2008   History of Helicobacter pylori infection 10/05/2007   OVERACTIVE BLADDER 10/05/2007   FIBROCYSTIC BREAST DISEASE 10/05/2007   HYPERHIDROSIS 10/05/2007   Past Medical History:  Diagnosis Date   Anxiety    Carotid bruit 09/27/2007   carotid doppler- normal   Depression    Female bladder prolapse    GERD (gastroesophageal reflux disease)    Overactive bladder    Presence of pessary    Sleep disorder    Past Surgical History:  Procedure Laterality Date   BUNIONECTOMY     right foot   CHOLECYSTECTOMY     Social History   Tobacco Use   Smoking status: Never   Smokeless tobacco: Never  Vaping Use   Vaping status: Never Used  Substance Use Topics   Alcohol use: No   Drug use: No   Family History  Problem Relation Age of Onset   Arthritis Mother        Rheumatoid   Dementia Mother    Heart disease Father        CABG   Hyperlipidemia Father    Cancer Maternal Aunt        breast   Breast cancer Maternal Aunt    Allergies  Allergen Reactions   Codeine     REACTION: GI upset   Meloxicam    Sulfonamide Derivatives     REACTION: hives   Zolpidem Tartrate     REACTION: ? headache   Current Outpatient Medications on File Prior to Visit  Medication Sig Dispense Refill   Ascorbic Acid (VITAMIN C PO) Take by mouth.     lidocaine  (XYLOCAINE ) 5 % ointment Apply 1 Application topically as needed. 35.44 g 0   Multiple Vitamins-Minerals (ZINC PO) Take by mouth.  Probiotic Product (PROBIOTIC DAILY PO) Take by mouth.     VITAMIN D  PO Take by mouth.     No current  facility-administered medications on file prior to visit.    Review of Systems  Constitutional:  Negative for activity change, appetite change, fatigue, fever and unexpected weight change.  HENT:  Negative for congestion, ear pain, rhinorrhea, sinus pressure and sore throat.   Eyes:  Negative for pain, redness and visual disturbance.  Respiratory:  Negative for cough, shortness of breath and wheezing.   Cardiovascular:  Negative for chest pain and palpitations.  Gastrointestinal:  Negative for abdominal pain, blood in stool, constipation and diarrhea.  Endocrine: Negative for polydipsia and polyuria.  Genitourinary:  Negative for dysuria, frequency and urgency.  Musculoskeletal:  Negative for arthralgias, back pain and myalgias.  Skin:  Negative for pallor and rash.  Allergic/Immunologic: Negative for environmental allergies.  Neurological:  Negative for dizziness, syncope and headaches.  Hematological:  Negative for adenopathy. Does not bruise/bleed easily.  Psychiatric/Behavioral:  Negative for decreased concentration and dysphoric mood. The patient is not nervous/anxious.        Objective:   Physical Exam Constitutional:      General: She is not in acute distress.    Appearance: Normal appearance. She is well-developed and normal weight. She is not ill-appearing or diaphoretic.  HENT:     Head: Normocephalic and atraumatic.     Right Ear: Tympanic membrane, ear canal and external ear normal.     Left Ear: Tympanic membrane, ear canal and external ear normal.     Nose: Nose normal. No congestion.     Mouth/Throat:     Mouth: Mucous membranes are moist.     Pharynx: Oropharynx is clear. No posterior oropharyngeal erythema.  Eyes:     General: No scleral icterus.    Extraocular Movements: Extraocular movements intact.     Conjunctiva/sclera: Conjunctivae normal.     Pupils: Pupils are equal, round, and reactive to light.  Neck:     Thyroid : No thyromegaly.     Vascular: No  carotid bruit or JVD.  Cardiovascular:     Rate and Rhythm: Normal rate and regular rhythm.     Pulses: Normal pulses.     Heart sounds: Normal heart sounds.     No gallop.  Pulmonary:     Effort: Pulmonary effort is normal. No respiratory distress.     Breath sounds: Normal breath sounds. No wheezing.     Comments: Good air exch Chest:     Chest wall: No tenderness.  Abdominal:     General: Bowel sounds are normal. There is no distension or abdominal bruit.     Palpations: Abdomen is soft. There is no mass.     Tenderness: There is no abdominal tenderness.     Hernia: No hernia is present.  Genitourinary:    Comments: Breast exam: No mass, nodules, thickening, tenderness, bulging, retraction, inflamation, nipple discharge or skin changes noted.  No axillary or clavicular LA.     Musculoskeletal:        General: No tenderness. Normal range of motion.     Cervical back: Normal range of motion and neck supple. No rigidity. No muscular tenderness.     Right lower leg: No edema.     Left lower leg: No edema.     Comments: No kyphosis   Lymphadenopathy:     Cervical: No cervical adenopathy.  Skin:    General: Skin is warm and dry.  Coloration: Skin is not pale.     Findings: No erythema or rash.     Comments: Solar lentigines diffusely   Neurological:     Mental Status: She is alert. Mental status is at baseline.     Cranial Nerves: No cranial nerve deficit.     Motor: No abnormal muscle tone.     Coordination: Coordination normal.     Gait: Gait normal.     Deep Tendon Reflexes: Reflexes are normal and symmetric. Reflexes normal.  Psychiatric:        Mood and Affect: Mood normal.        Cognition and Memory: Cognition and memory normal.           Assessment & Plan:   Problem List Items Addressed This Visit       Musculoskeletal and Integument   Osteoporosis   Dexa 08/2022- due this feb/ will call for order No falls or fractures  Last vitamin D  Lab Results   Component Value Date   VD25OH 40.22 05/26/2024   Great exercise -commended Declines pharm therapy at this time (did review info on alendronate) Fam history of osteoporosis in mother   Discussed fall prevention, supplements and exercise for bone density          Other   Insomnia   Trazodone  prn  Does not use every day  Helpful       Hyperkalemia   Normal K today  Lab Results  Component Value Date   K 4.5 05/26/2024         HYPERCHOLESTEROLEMIA - Primary   Disc goals for lipids and reasons to control them Rev last labs with pt Rev low sat fat diet in detail Stable with LDL 136 and ratio of 4 Ascvd risk 6% Cardiac ca score 0  Continue to monitor  Good diet       Decreased GFR   Slightly decreased GFR No symptoms  58.35 ? If age related or need for better hydration in setting of more exercise   Will continue to monitor  Encouraged strongly to drink more fluids/ water       Colon cancer screening   Cologuard negative 05/2023  No stool changes

## 2024-06-01 NOTE — Patient Instructions (Addendum)
 Your bone density test is due in February  Let us  know if you want to schedule it (we would do an order)   Avoid added sugars in your diet when you can  Try to get most of your carbohydrates from produce (with the exception of white potatoes) and whole grains Eat less bread/pasta/rice/snack foods/cereals/sweets and other items from the middle of the grocery store (processed carbs)  Push water intake for kidney health   Keep up the great exercise

## 2024-06-01 NOTE — Assessment & Plan Note (Signed)
 Cologuard negative 05/2023  No stool changes

## 2024-06-01 NOTE — Assessment & Plan Note (Signed)
 Slightly decreased GFR No symptoms  58.35 ? If age related or need for better hydration in setting of more exercise   Will continue to monitor  Encouraged strongly to drink more fluids/ water

## 2024-06-08 ENCOUNTER — Encounter: Payer: Self-pay | Admitting: Family Medicine

## 2024-09-06 ENCOUNTER — Encounter: Payer: Self-pay | Admitting: *Deleted

## 2024-09-15 ENCOUNTER — Observation Stay
Admission: EM | Admit: 2024-09-15 | Discharge: 2024-09-16 | Disposition: A | Discharge status: Home / Self Care | Attending: Internal Medicine | Admitting: Internal Medicine

## 2024-09-15 ENCOUNTER — Encounter: Payer: Self-pay | Admitting: Emergency Medicine

## 2024-09-15 ENCOUNTER — Emergency Department

## 2024-09-15 ENCOUNTER — Other Ambulatory Visit: Payer: Self-pay

## 2024-09-15 ENCOUNTER — Observation Stay

## 2024-09-15 DIAGNOSIS — I959 Hypotension, unspecified: Secondary | ICD-10-CM | POA: Insufficient documentation

## 2024-09-15 DIAGNOSIS — R55 Syncope and collapse: Principal | ICD-10-CM | POA: Insufficient documentation

## 2024-09-15 DIAGNOSIS — R110 Nausea: Secondary | ICD-10-CM | POA: Insufficient documentation

## 2024-09-15 DIAGNOSIS — E872 Acidosis, unspecified: Secondary | ICD-10-CM | POA: Diagnosis not present

## 2024-09-15 DIAGNOSIS — R6510 Systemic inflammatory response syndrome (SIRS) of non-infectious origin without acute organ dysfunction: Secondary | ICD-10-CM | POA: Diagnosis not present

## 2024-09-15 LAB — URINALYSIS, W/ REFLEX TO CULTURE (INFECTION SUSPECTED)
Bacteria, UA: NONE SEEN
Bilirubin Urine: NEGATIVE
Glucose, UA: NEGATIVE mg/dL
Hgb urine dipstick: NEGATIVE
Ketones, ur: NEGATIVE mg/dL
Leukocytes,Ua: NEGATIVE
Nitrite: NEGATIVE
Protein, ur: NEGATIVE mg/dL
Specific Gravity, Urine: 1.01 (ref 1.005–1.030)
pH: 8 (ref 5.0–8.0)

## 2024-09-15 LAB — CBC WITH DIFFERENTIAL/PLATELET
Abs Immature Granulocytes: 0.03 K/uL (ref 0.00–0.07)
Basophils Absolute: 0 K/uL (ref 0.0–0.1)
Basophils Relative: 1 %
Eosinophils Absolute: 0.2 K/uL (ref 0.0–0.5)
Eosinophils Relative: 2 %
HCT: 35.5 % — ABNORMAL LOW (ref 36.0–46.0)
Hemoglobin: 11.8 g/dL — ABNORMAL LOW (ref 12.0–15.0)
Immature Granulocytes: 0 %
Lymphocytes Relative: 19 %
Lymphs Abs: 1.4 K/uL (ref 0.7–4.0)
MCH: 30.7 pg (ref 26.0–34.0)
MCHC: 33.2 g/dL (ref 30.0–36.0)
MCV: 92.4 fL (ref 80.0–100.0)
Monocytes Absolute: 0.6 K/uL (ref 0.1–1.0)
Monocytes Relative: 7 %
Neutro Abs: 5.4 K/uL (ref 1.7–7.7)
Neutrophils Relative %: 71 %
Platelets: 201 K/uL (ref 150–400)
RBC: 3.84 MIL/uL — ABNORMAL LOW (ref 3.87–5.11)
RDW: 13.5 % (ref 11.5–15.5)
WBC: 7.6 K/uL (ref 4.0–10.5)
nRBC: 0 % (ref 0.0–0.2)

## 2024-09-15 LAB — CORTISOL: Cortisol, Plasma: 14 ug/dL

## 2024-09-15 LAB — COMPREHENSIVE METABOLIC PANEL WITH GFR
ALT: 20 U/L (ref 0–44)
AST: 24 U/L (ref 15–41)
Albumin: 3.6 g/dL (ref 3.5–5.0)
Alkaline Phosphatase: 89 U/L (ref 38–126)
Anion gap: 13 (ref 5–15)
BUN: 16 mg/dL (ref 8–23)
CO2: 21 mmol/L — ABNORMAL LOW (ref 22–32)
Calcium: 8.8 mg/dL — ABNORMAL LOW (ref 8.9–10.3)
Chloride: 106 mmol/L (ref 98–111)
Creatinine, Ser: 0.92 mg/dL (ref 0.44–1.00)
GFR, Estimated: >60 mL/min
Glucose, Bld: 123 mg/dL — ABNORMAL HIGH (ref 70–99)
Potassium: 4.1 mmol/L (ref 3.5–5.1)
Sodium: 139 mmol/L (ref 135–145)
Total Bilirubin: 0.5 mg/dL (ref 0.0–1.2)
Total Protein: 6.1 g/dL — ABNORMAL LOW (ref 6.5–8.1)

## 2024-09-15 LAB — LACTIC ACID, PLASMA
Lactic Acid, Venous: 3.6 mmol/L (ref 0.5–1.9)
Lactic Acid, Venous: 2.9 mmol/L (ref 0.5–1.9)
Lactic Acid, Venous: 1.8 mmol/L (ref 0.5–1.9)

## 2024-09-15 LAB — TROPONIN T, HIGH SENSITIVITY
Troponin T High Sensitivity: <6 ng/L (ref 0–19)
Troponin T High Sensitivity: 6 ng/L (ref 0–19)

## 2024-09-15 LAB — TSH: TSH: 2.43 u[IU]/mL (ref 0.350–4.500)

## 2024-09-15 MED ORDER — ONDANSETRON HCL 4 MG PO TABS: 4.0000 mg | ORAL_TABLET | Freq: Four times a day (QID) | ORAL | Status: DC | PRN

## 2024-09-15 MED ORDER — LACTATED RINGERS IV BOLUS
1000.0000 mL | Freq: Once | INTRAVENOUS | Status: AC
  Administered 2024-09-15: 1000 mL via INTRAVENOUS

## 2024-09-15 MED ORDER — SODIUM CHLORIDE 0.9 % IV SOLN: INTRAVENOUS | Status: AC

## 2024-09-15 MED ORDER — ACETAMINOPHEN 325 MG PO TABS
650.0000 mg | ORAL_TABLET | Freq: Four times a day (QID) | ORAL | Status: DC | PRN
  Administered 2024-09-15: 650 mg via ORAL
  Filled 2024-09-15: qty 2

## 2024-09-15 MED ORDER — TRAZODONE HCL 50 MG PO TABS: 50.0000 mg | ORAL_TABLET | Freq: Every evening | ORAL | Status: DC | PRN

## 2024-09-15 MED ORDER — ONDANSETRON HCL 4 MG/2ML IJ SOLN: 4.0000 mg | Freq: Four times a day (QID) | INTRAMUSCULAR | Status: DC | PRN

## 2024-09-15 MED ORDER — ONDANSETRON HCL 4 MG/2ML IJ SOLN
4.0000 mg | Freq: Once | INTRAMUSCULAR | Status: AC
  Administered 2024-09-15: 4 mg via INTRAVENOUS
  Filled 2024-09-15: qty 2

## 2024-09-15 MED ORDER — ONDANSETRON HCL 4 MG/2ML IJ SOLN
4.0000 mg | Freq: Once | INTRAMUSCULAR | Status: DC
  Filled 2024-09-15: qty 2

## 2024-09-15 MED ORDER — SODIUM CHLORIDE 0.9% FLUSH
3.0000 mL | Freq: Two times a day (BID) | INTRAVENOUS | Status: DC
  Administered 2024-09-15 – 2024-09-16 (×3): 3 mL via INTRAVENOUS

## 2024-09-15 MED ORDER — SODIUM CHLORIDE 0.9 % IV BOLUS
500.0000 mL | Freq: Once | INTRAVENOUS | Status: AC
  Administered 2024-09-15: 500 mL via INTRAVENOUS

## 2024-09-15 MED ORDER — SENNOSIDES-DOCUSATE SODIUM 8.6-50 MG PO TABS: 1.0000 | ORAL_TABLET | Freq: Every evening | ORAL | Status: DC | PRN

## 2024-09-15 MED ORDER — ACETAMINOPHEN 650 MG RE SUPP: 650.0000 mg | Freq: Four times a day (QID) | RECTAL | Status: DC | PRN

## 2024-09-15 NOTE — Plan of Care (Signed)

## 2024-09-15 NOTE — ED Provider Notes (Signed)
 [facility] Provider Note    Event Date/Time   First MD Initiated Contact with Patient 09/15/24 [phone]     (approximate)   History   Loss of Consciousness   HPI  Katherine Macdonald is a 69 y.o. female with history of anxiety, GERD, hyperlipidemia, presenting with syncopal episode.  Patient states that she went to the bathroom, was washing her hands at her sink, felt lightheaded and passed out.  She notes some nausea.  Denies any chest pain or shortness of breath.  No focal weakness or numbness.  No history of cancer, no hormone use, no unilateral calf swelling or tenderness, no recent travel or surgeries.  Denies vomiting or diarrhea, no urinary symptoms.  She did take a trazodone  tonight she does not take it regularly.  Per independent history from husband, he thought she was mildly confused when she woke up but is now cleared and back to baseline.  Daughter states no history of malignancies, no history of arrhythmia or heart attacks in the past.  On independent chart review she does have history of insomnia, takes trazodone  as needed, history of hyperlipidemia.     Physical Exam   Triage Vital Signs: ED Triage Vitals  Encounter Vitals Group     BP 09/15/24 0457 (!) 95/52     Girls Systolic BP Percentile --      Girls Diastolic BP Percentile --      Boys Systolic BP Percentile --      Boys Diastolic BP Percentile --      Pulse Rate 09/15/24 0457 (!) 58     Resp 09/15/24 0457 16     Temp 09/15/24 0457 97.6 F (36.4 C)     Temp Source 09/15/24 0457 Oral     SpO2 09/15/24 0457 100 %     Weight --      Height --      Head Circumference --      Peak Flow --      Pain Score 09/15/24 0501 0     Pain Loc --      Pain Education --      Exclude from Growth Chart --     Most recent vital signs: Vitals:   09/15/24 0457 09/15/24 0530  BP: (!) 95/52 (!) 94/52  Pulse: (!) 58 (!) 55  Resp: 16 16  Temp: 97.6 F (36.4 C)   SpO2: 100% 100%      General: Awake, no distress.  CV:  Good peripheral perfusion.  Resp:  Normal effort.  No tachypnea or respiratory distress Abd:  No distention.  Soft nontender Other:  No palpable skull deformities or tenderness, pupils are equal, extraocular movements are intact, no midline spinal tenderness, no tenderness to her extremities.  No thoracic cage tenderness.  No focal weakness or numbness, no slurred speech or facial droop.  Mildly dry oral mucosa.  She is A and O x 3 here.   ED Results / Procedures / Treatments   Labs (all labs ordered are listed, but only abnormal results are displayed) Labs Reviewed  COMPREHENSIVE METABOLIC PANEL WITH GFR - Abnormal; Notable for the following components:      Result Value   CO2 21 (*)    Glucose, Bld 123 (*)    Calcium 8.8 (*)    Total Protein 6.1 (*)    All other components within normal limits  CBC WITH DIFFERENTIAL/PLATELET - Abnormal; Notable for the following components:   RBC 3.84 (*)  Hemoglobin 11.8 (*)    HCT 35.5 (*)    All other components within normal limits  LACTIC ACID, PLASMA - Abnormal; Notable for the following components:   Lactic Acid, Venous 3.6 (*)    All other components within normal limits  CULTURE, BLOOD (ROUTINE X 2)  CULTURE, BLOOD (ROUTINE X 2)  URINALYSIS, W/ REFLEX TO CULTURE (INFECTION SUSPECTED)  LACTIC ACID, PLASMA  TROPONIN T, HIGH SENSITIVITY     EKG  EKG shows, sinus rhythm, rate of 55, normal QS, normal QTc, no obvious ischemic ST elevation, T wave flattening in 3, not significantly changed compared to prior   RADIOLOGY On my independent interpretation, CT head without obvious intracranial hemorrhage   PROCEDURES:  Critical Care performed: No  Procedures   MEDICATIONS ORDERED IN ED: Medications  lactated ringers  bolus 1,000 mL (1,000 mLs Intravenous New Bag/Given 09/15/24 0521)  ondansetron  (ZOFRAN ) injection 4 mg (4 mg Intravenous Given 09/15/24 0603)  lactated ringers  bolus 1,000  mL (1,000 mLs Intravenous New Bag/Given 09/15/24 0603)     IMPRESSION / MDM / ASSESSMENT AND PLAN / ED COURSE  I reviewed the triage vital signs and the nursing notes.                              Differential diagnosis includes, but is not limited to, did consider arrhythmia, atypical ACS, dehydration, electrolyte derangements, possible infection such as UTI.  Did also consider if this is medication side effect from the trazodone .  She has no shortness of breath or other risk factors for PE, doubt PE at this time.  For her fall, will get a CT head and cervical spine to make sure no fractures or intracranial hemorrhage.  Labs, EKG, troponin, UA.  IV fluids.  Reassess.  Patient's presentation is most consistent with acute presentation with potential threat to life or bodily function.  Independent interpretation of labs and imaging below.  Patient signed out pending UA, repeat troponin, reassessment.  The patient is on the cardiac monitor to evaluate for evidence of arrhythmia and/or significant heart rate changes.   Clinical Course as of 09/15/24 0619  Wed Sep 15, 2024  [id number] Independent review of labs, no leukocytosis, electrolytes not severely deranged, LFTs are normal, troponin is not elevated, her lactate is elevated.  No obvious infectious symptoms at this time other than nausea.  Will add on blood cultures.  Will encourage urine sample. [TT]  0556 CT Head Wo Contrast No acute intracranial abnormality.  [TT]  0618 CT Cervical Spine Wo Contrast 1. No evidence for cervical spine fracture or traumatic subluxation.  [TT]    Clinical Course User Index [TT] Waymond Lorelle Cummins, MD     FINAL CLINICAL IMPRESSION(S) / ED DIAGNOSES   Final diagnoses:  Syncope, unspecified syncope type  Nausea     Rx / DC Orders   ED Discharge Orders     None        Note:  This document was prepared using Dragon voice recognition software and may include unintentional dictation errors.    Waymond Lorelle Cummins, MD 09/15/24 [phone]

## 2024-09-15 NOTE — ED Provider Notes (Signed)
 8:09 AM Assumed care for off going team.   Blood pressure (!) 95/51, pulse 61, temperature 97.6 F (36.4 C), temperature source Oral, resp. rate 15, last menstrual period 08/26/2008, SpO2 100%.  See their HPI for full report but in briet pending labs  Repeat negative repeat lactate is downtrending but not completely normalized.  Patient reports feeling improved with completely back to her baseline self.  She denies having any prodromal symptoms or viral symptoms prior to this happening.  Therefore I did discuss with patient admission for cardiac monitoring, echocardiogram and continued fluid resuscitation as she still has an elevated lactic.  Patient feels comfortable with this plan.  Patient's repeat blood pressures are in the 100 systolic.        Ernest Ronal BRAVO, MD 09/15/24 [phone]

## 2024-09-15 NOTE — H&P (Addendum)
 " History and Physical    Katherine Macdonald FMW:[id number] DOB: 1956/07/11 DOA: 09/15/2024  PCP: Katherine Macdonald LABOR, MD (Confirm with patient/family/NH records and if not entered, this has to be entered at [facility] point of entry) Patient coming from: Home  I have personally briefly reviewed patient's old medical records in [facility]  Chief Complaint: I passed out  HPI: Katherine Macdonald is a 69 y.o. female with medical history significant of anxiety/depression, presented with syncope.  Patient woke up last night to go to bathroom, suddenly felt lightheadedness and slumped over back-to-back loss of consciousness about 30 seconds.  Husband at bedside saw the patient looked pale and sweaty.  But denied any seizure-like movements no loss control urine or bowel movements.  Patient reported that  had vasovagal before during a dental procedure.  But she denied any nausea vomiting diarrhea yesterday, she felt normal daytime yesterday.  Denied any possibility of dehydration.  No urinary symptoms no cough.  At baseline, patient is active and goes to gym every day.  ED Course: Bradycardia, blood pressure 95/52, blood pressure improved to 109/2:56 liters of IV bolus.  CT head and neck negative for fracture or dislocation, blood work showed lactic acid 3.9>2.9, UA negative for UTI, chest x-ray negative for acute infiltrates.  Review of Systems: As per HPI otherwise 14 point review of systems negative.    Past Medical History:  Diagnosis Date   Anxiety    Carotid bruit 09/27/2007   carotid doppler- normal   Depression    Female bladder prolapse    GERD (gastroesophageal reflux disease)    Overactive bladder    Presence of pessary    Sleep disorder     Past Surgical History:  Procedure Laterality Date   BUNIONECTOMY     right foot   CHOLECYSTECTOMY       reports that she has never smoked. She has never used smokeless tobacco. She reports that she does not drink alcohol and does not use  drugs.  Allergies[1]  Family History  Problem Relation Age of Onset   Arthritis Mother        Rheumatoid   Dementia Mother    Heart disease Father        CABG   Hyperlipidemia Father    Cancer Maternal Aunt        breast   Breast cancer Maternal Aunt      Prior to Admission medications  Medication Sig Start Date End Date Taking? Authorizing Provider  traZODone  (DESYREL ) 50 MG tablet Take 1-2 tablets (50-100 mg total) by mouth at bedtime as needed. for sleep 06/01/24  Yes Tower, Macdonald LABOR, MD    Physical Exam: Vitals:   09/15/24 0830 09/15/24 0930 09/15/24 1000 09/15/24 1032  BP: (!) 117/55 (!) 109/56 102/62   Pulse: 75 71 68   Resp: (!) 21 11 17    Temp:    98 F (36.7 C)  TempSrc:    Oral  SpO2: 99% 99% 100%     Constitutional: NAD, calm, comfortable Vitals:   09/15/24 0830 09/15/24 0930 09/15/24 1000 09/15/24 1032  BP: (!) 117/55 (!) 109/56 102/62   Pulse: 75 71 68   Resp: (!) 21 11 17    Temp:    98 F (36.7 C)  TempSrc:    Oral  SpO2: 99% 99% 100%    Eyes: PERRL, lids and conjunctivae normal ENMT: Mucous membranes are dry. Posterior pharynx clear of any exudate or lesions.Normal dentition.  Neck: normal, supple,  no masses, no thyromegaly Respiratory: clear to auscultation bilaterally, no wheezing, no crackles. Normal respiratory effort. No accessory muscle use.  Cardiovascular: Regular rate and rhythm, soft systolic murmur of heart base. No extremity edema. 2+ pedal pulses. No carotid bruits.  Abdomen: no tenderness, no masses palpated. No hepatosplenomegaly. Bowel sounds positive.  Musculoskeletal: no clubbing / cyanosis. No joint deformity upper and lower extremities. Good ROM, no contractures. Normal muscle tone.  Skin: no rashes, lesions, ulcers. No induration Neurologic: CN 2-12 grossly intact. Sensation intact, DTR normal. Strength 5/5 in all 4.  Psychiatric: Normal judgment and insight. Alert and oriented x 3. Normal mood.   Labs on Admission: I have  personally reviewed following labs and imaging studies  CBC: Recent Labs  Lab 09/15/24 0506  WBC 7.6  NEUTROABS 5.4  HGB 11.8*  HCT 35.5*  MCV 92.4  PLT 201   Basic Metabolic Panel: Recent Labs  Lab 09/15/24 0506  NA 139  K 4.1  CL 106  CO2 21*  GLUCOSE 123*  BUN 16  CREATININE 0.92  CALCIUM 8.8*   GFR: CrCl cannot be calculated (Unknown ideal weight.). Liver Function Tests: Recent Labs  Lab 09/15/24 0506  AST 24  ALT 20  ALKPHOS 89  BILITOT 0.5  PROT 6.1*  ALBUMIN 3.6   No results for input(s): LIPASE, AMYLASE in the last 168 hours. No results for input(s): AMMONIA in the last 168 hours. Coagulation Profile: No results for input(s): INR, PROTIME in the last 168 hours. Cardiac Enzymes: No results for input(s): CKTOTAL, CKMB, CKMBINDEX, TROPONINI in the last 168 hours. BNP (last 3 results) No results for input(s): PROBNP in the last 8760 hours. HbA1C: No results for input(s): HGBA1C in the last 72 hours. CBG: No results for input(s): GLUCAP in the last 168 hours. Lipid Profile: No results for input(s): CHOL, HDL, LDLCALC, TRIG, CHOLHDL, LDLDIRECT in the last 72 hours. Thyroid  Function Tests: No results for input(s): TSH, T4TOTAL, FREET4, T3FREE, THYROIDAB in the last 72 hours. Anemia Panel: No results for input(s): VITAMINB12, FOLATE, FERRITIN, TIBC, IRON, RETICCTPCT in the last 72 hours. Urine analysis:    Component Value Date/Time   COLORURINE YELLOW (A) 09/15/2024 0755   APPEARANCEUR CLEAR (A) 09/15/2024 0755   APPEARANCEUR Cloudy 11/25/2013 0545   LABSPEC 1.010 09/15/2024 0755   LABSPEC 1.023 11/25/2013 0545   PHURINE 8.0 09/15/2024 0755   GLUCOSEU NEGATIVE 09/15/2024 0755   GLUCOSEU 150 mg/dL 95/97/7984 9454   HGBUR NEGATIVE 09/15/2024 0755   HGBUR large 12/25/2007 1522   BILIRUBINUR NEGATIVE 09/15/2024 0755   BILIRUBINUR Negative 04/18/2023 1443   BILIRUBINUR Negative 11/25/2013 0545    KETONESUR NEGATIVE 09/15/2024 0755   PROTEINUR NEGATIVE 09/15/2024 0755   UROBILINOGEN 0.2 04/18/2023 1443   UROBILINOGEN 0.2 12/25/2007 1522   NITRITE NEGATIVE 09/15/2024 0755   LEUKOCYTESUR NEGATIVE 09/15/2024 0755   LEUKOCYTESUR 1+ 11/25/2013 0545    Radiological Exams on Admission: CT Cervical Spine Wo Contrast Result Date: 09/15/2024 CLINICAL DATA:  Syncopal episode. EXAM: CT CERVICAL SPINE WITHOUT CONTRAST TECHNIQUE: Multidetector CT imaging of the cervical spine was performed without intravenous contrast. Multiplanar CT image reconstructions were also generated. RADIATION DOSE REDUCTION: This exam was performed according to the departmental dose-optimization program which includes automated exposure control, adjustment of the mA and/or kV according to patient size and/or use of iterative reconstruction technique. COMPARISON:  None Available. FINDINGS: Alignment: Trace anterolisthesis of C4 on 5 is most likely degenerative given the loss of facet space in the upper cervical spine. No evidence  for traumatic subluxation. Skull base and vertebrae: No acute fracture. No primary bone lesion or focal pathologic process. Soft tissues and spinal canal: No prevertebral fluid or swelling. No visible canal hematoma. Disc levels:  Loss of disc height noted C6-7. Upper chest: Mild biapical pleuroparenchymal scarring. Other: None. IMPRESSION: 1. No evidence for cervical spine fracture or traumatic subluxation. Electronically Signed   By: Camellia Candle M.D.   On: 09/15/2024 05:53   CT Head Wo Contrast Result Date: 09/15/2024 CLINICAL DATA:  Syncopal episode.  Confusion. EXAM: CT HEAD WITHOUT CONTRAST TECHNIQUE: Contiguous axial images were obtained from the base of the skull through the vertex without intravenous contrast. RADIATION DOSE REDUCTION: This exam was performed according to the departmental dose-optimization program which includes automated exposure control, adjustment of the mA and/or kV  according to patient size and/or use of iterative reconstruction technique. COMPARISON:  No comparison studies available. FINDINGS: Brain: There is no evidence for acute hemorrhage, hydrocephalus, mass lesion, or abnormal extra-axial fluid collection. No definite CT evidence for acute infarction. Vascular: No hyperdense vessel or unexpected calcification. Skull: No evidence for fracture. No worrisome lytic or sclerotic lesion. Sinuses/Orbits: The visualized paranasal sinuses and mastoid air cells are clear. Visualized portions of the globes and intraorbital fat are unremarkable. Other: None. IMPRESSION: No acute intracranial abnormality. Electronically Signed   By: Camellia Candle M.D.   On: 09/15/2024 05:51    EKG: Pending  Assessment/Plan Principal Problem:   Syncope Active Problems:   Syncope, vasovagal  (please populate well all problems here in Problem List. (For example, if patient is on BP meds at home and you resume or decide to hold them, it is a problem that needs to be her. Same for CAD, COPD, HLD and so on)  Vasovagal syncope - Patient was hypotensive and with the elevated lactic acid level on presentation, blood pressure significantly improved after initial management of 2 L IV bolus.  Clinically patient still appeared to be volume contracted, will continue maintenance IV fluid and encourage increasin p.o. intake. - Telemonitoring x 24 hours -Orthostatic vitals - EKG pending to rule out any significant PR or QTc interval changes.  On telemonitoring in ED, there was no significant ventricular arrhythmia. - Other Dx, she does have a systolic murmur however at baseline patient has no history of presyncope or syncope during strenuous exercise, low suspicion for structural cardiac problem, recommend that she can have echocardiogram done outpatient  SIRS Lactic acidosis Hypotension - Patient denies any cough, no runny nose sore throat, no urinary symptoms no diarrhea. - Chest x-ray clear,  UA negative for UTI, no leukocytosis.  Plan to hold off antibiotics.  IV hydration and recheck lactic acid level supplement.  Blood culture pending. - Other DDx, TSH and cortisol level sent.  DVT prophylaxis: SCD Code Status: Full code Family Communication: Daughters at bedside Disposition Plan: Expect less than 2 midnight hospital stay Consults called: None Admission status: Telemetry observation   Cort ONEIDA Mana MD [facility] Pager [phone]  09/15/2024, 10:43 AM        [1]  Allergies Allergen Reactions   Codeine     REACTION: GI upset   Meloxicam    Sulfonamide Derivatives     REACTION: hives   Zolpidem Tartrate     REACTION: ? headache   "

## 2024-09-15 NOTE — ED Triage Notes (Addendum)
 Patient presents via EMS from home after syncopal episode after using bathroom this am. Husband reports patient was confused after episode. Patient reports taking trazodone  last night and unsure if sx related. Patient arrives alert and oriented. Denies pain

## 2024-09-15 NOTE — TOC CM/SW Note (Signed)
 Transition of Care [facility]) - Inpatient Brief Assessment   Patient Details  Name: Katherine Macdonald MRN: [id number] Date of Birth: May 24, 1956  Transition of Care [facility]) CM/SW Contact:    Nathanael CHRISTELLA Ring, RN Phone Number: 09/15/2024, 1:51 PM   Clinical Narrative:   Transition of Care [facility]) Screening Note   Patient Details  Name: Katherine Macdonald Date of Birth: 06/25/1956   Transition of Care [facility]) CM/SW Contact:    Nathanael CHRISTELLA Ring, RN Phone Number: 09/15/2024, 1:51 PM    Transition of Care Department [facility]) has reviewed patient and no TOC needs have been identified at this time.  If new patient transition needs arise, please place a TOC consult.    Transition of Care Asessment: Insurance and Status: Insurance coverage has been reviewed Patient has primary care physician: Yes Home environment has been reviewed: Home with husband Prior level of function:: Independent Prior/Current Home Services: No current home services Social Drivers of Health Review: SDOH reviewed no interventions necessary Readmission risk has been reviewed: No (under observation) Transition of care needs: no transition of care needs at this time

## 2024-09-15 NOTE — ED Notes (Signed)
 MD at bedside.

## 2024-09-16 LAB — HIV ANTIBODY (ROUTINE TESTING W REFLEX): HIV Screen 4th Generation wRfx: NONREACTIVE

## 2024-09-16 LAB — GLUCOSE, CAPILLARY: Glucose-Capillary: 108 mg/dL — ABNORMAL HIGH (ref 70–99)

## 2024-09-16 NOTE — Plan of Care (Signed)
" °  Problem: Cardiac: Goal: Will achieve and/or maintain adequate cardiac output Outcome: Progressing   Problem: Education: Goal: Knowledge of General Education information will improve Description: Including pain rating scale, medication(s)/side effects and non-pharmacologic comfort measures Outcome: Progressing   Problem: Clinical Measurements: Goal: Ability to maintain clinical measurements within normal limits will improve Outcome: Progressing Goal: Will remain free from infection Outcome: Progressing Goal: Respiratory complications will improve Outcome: Progressing Goal: Cardiovascular complication will be avoided Outcome: Progressing   Problem: Activity: Goal: Risk for activity intolerance will decrease Outcome: Progressing   "

## 2024-09-16 NOTE — Discharge Instructions (Addendum)
 Monitor BP at home and hydrate well before and after workout sessions!

## 2024-09-16 NOTE — Progress Notes (Signed)
 Mobility Specialist - Progress Note   09/16/24 1200  Mobility  Activity Ambulated with assistance  Level of Assistance Standby assist, set-up cues, supervision of patient - no hands on  Assistive Device None  Distance Ambulated (ft) 200 ft  Range of Motion/Exercises All extremities  Activity Response Tolerated well  Mobility visit 1 Mobility  Mobility Specialist Start Time (ACUTE ONLY) [id number]  Mobility Specialist Stop Time (ACUTE ONLY) 0940  Mobility Specialist Time Calculation (min) (ACUTE ONLY) 4 min   Pt was supine in bed with the HOB elevated on RA with guest in the room upon entry. Pt agreed to mobility. Pt is able today to get to the EOB independently with no bed features. Pt is able today to STS independently with no AD. Pt ambulated well throughout activity. Pt vitals remained WNL throughout activity. After activity Physician was able to greet pt After activity with needs in reach.  Clem Rodes Mobility Specialist 09/16/24, 12:19 PM

## 2024-09-16 NOTE — Discharge Summary (Signed)
 " Physician Discharge Summary   Patient: Katherine Macdonald MRN: [id number] DOB: 1955-09-01  Admit date:     09/15/2024  Discharge date: 09/16/24  Discharge Physician: Leita Blanch   PCP: Randeen Laine LABOR, MD   Recommendations at discharge:    F/u PCP in 1-2 weeks Monitor BP at home and hydrate well before and after workout sessions!  Discharge Diagnoses: Principal Problem:   Syncope Active Problems:   Syncope, vasovagal   Katherine Macdonald is a 69 y.o. female with medical history significant of anxiety/depression, presented with syncope.   Patient woke up last night to go to bathroom, suddenly felt lightheadedness and slumped over back-to-back loss of consciousness about 30 seconds.  Husband at bedside saw the patient looked pale and sweaty.  But denied any seizure-like movements no loss control urine or bowel movements.  Patient reported that  had vasovagal before during a dental procedure.   Patient reports Monday evening she had an intense workout with her trainer at the gym. She thinks she did not hydrate well.  Vasovagal syncope suspected due to dehydration - Patient was hypotensive and with the elevated lactic acid level on presentation, blood pressure significantly improved after initial management of 2 L IV bolus.  Clinically patient still appeared to be volume contracted, will continue maintenance IV fluid and encourage increasin p.o. intake. --Telemonitoring x 24 hours--remains NSR -- patient ambulated well -- patient feels back to baseline. Blood pressure stable. No dizziness. Tolerating PO diet well. -- Discussed importance of hydration before and after intense workout. She voiced understanding. -- Imaging studies no acute abnormality  SIRS Lactic acidosis Hypotension - Patient denies any cough, no runny nose sore throat, no urinary symptoms no diarrhea. - Chest x-ray clear, UA negative for UTI, no leukocytosis.  Plan to hold off antibiotics.  IV hydration and recheck lactic  acid level supplement.  Blood culture negative -- lactic acidosis resolved.  Discussed discharge plan with patient she is agreeable with it. Husband at bedside agrees with plan as well     Consultants: none Procedures performed: none Disposition: Home Diet recommendation:  Discharge Diet Orders (From admission, onward)     Start     Ordered   09/16/24 0000  Diet general        09/16/24 0959           Regular diet DISCHARGE MEDICATION: Allergies as of 09/16/2024       Reactions   Codeine    REACTION: GI upset   Meloxicam    Sulfonamide Derivatives    REACTION: hives   Zolpidem Tartrate    REACTION: ? headache        Medication List     TAKE these medications    traZODone  50 MG tablet Commonly known as: DESYREL  Take 1-2 tablets (50-100 mg total) by mouth at bedtime as needed. for sleep        Follow-up Information     Tower, Laine LABOR, MD Follow up.   Specialties: Family Medicine, Radiology Why: hospital follow up, As needed Contact information: [address] [place] [address] [phone]                Discharge Exam: Katherine Macdonald   09/15/24 1338 09/16/24 0451  Weight: 59 kg 65.6 kg   Alert and oriented times three respiratory clear to auscultation cardiovascular both heart sounds normal no murmur neuro- grossly intact  Condition at discharge: fair  The results of significant diagnostics from this hospitalization (including  imaging, microbiology, ancillary and laboratory) are listed below for reference.   Imaging Studies: Portable Chest 1 View Result Date: 09/15/2024 CLINICAL DATA:  Syncopal episode after using the bathroom this morning. EXAM: PORTABLE CHEST 1 VIEW COMPARISON:  None Available. FINDINGS: Lungs are adequately inflated without focal airspace consolidation or effusion. Cardiomediastinal silhouette and remainder of the exam is unremarkable. IMPRESSION: No active disease. Electronically Signed   By: Toribio Agreste M.D.   On: 09/15/2024 10:54   CT Cervical Spine Wo Contrast Result Date: 09/15/2024 CLINICAL DATA:  Syncopal episode. EXAM: CT CERVICAL SPINE WITHOUT CONTRAST TECHNIQUE: Multidetector CT imaging of the cervical spine was performed without intravenous contrast. Multiplanar CT image reconstructions were also generated. RADIATION DOSE REDUCTION: This exam was performed according to the departmental dose-optimization program which includes automated exposure control, adjustment of the mA and/or kV according to patient size and/or use of iterative reconstruction technique. COMPARISON:  None Available. FINDINGS: Alignment: Trace anterolisthesis of C4 on 5 is most likely degenerative given the loss of facet space in the upper cervical spine. No evidence for traumatic subluxation. Skull base and vertebrae: No acute fracture. No primary bone lesion or focal pathologic process. Soft tissues and spinal canal: No prevertebral fluid or swelling. No visible canal hematoma. Disc levels:  Loss of disc height noted C6-7. Upper chest: Mild biapical pleuroparenchymal scarring. Other: None. IMPRESSION: 1. No evidence for cervical spine fracture or traumatic subluxation. Electronically Signed   By: Camellia Candle M.D.   On: 09/15/2024 05:53   CT Head Wo Contrast Result Date: 09/15/2024 CLINICAL DATA:  Syncopal episode.  Confusion. EXAM: CT HEAD WITHOUT CONTRAST TECHNIQUE: Contiguous axial images were obtained from the base of the skull through the vertex without intravenous contrast. RADIATION DOSE REDUCTION: This exam was performed according to the departmental dose-optimization program which includes automated exposure control, adjustment of the mA and/or kV according to patient size and/or use of iterative reconstruction technique. COMPARISON:  No comparison studies available. FINDINGS: Brain: There is no evidence for acute hemorrhage, hydrocephalus, mass lesion, or abnormal extra-axial fluid collection. No definite CT  evidence for acute infarction. Vascular: No hyperdense vessel or unexpected calcification. Skull: No evidence for fracture. No worrisome lytic or sclerotic lesion. Sinuses/Orbits: The visualized paranasal sinuses and mastoid air cells are clear. Visualized portions of the globes and intraorbital fat are unremarkable. Other: None. IMPRESSION: No acute intracranial abnormality. Electronically Signed   By: Camellia Candle M.D.   On: 09/15/2024 05:51    Microbiology: Results for orders placed or performed during the hospital encounter of 09/15/24  Culture, blood (routine x 2)     Status: None (Preliminary result)   Collection Time: 09/15/24  6:11 AM   Specimen: BLOOD  Result Value Ref Range Status   Specimen Description BLOOD BLOOD RIGHT ARM  Final   Special Requests   Final    BOTTLES DRAWN AEROBIC AND ANAEROBIC Blood Culture results may not be optimal due to an inadequate volume of blood received in culture bottles   Culture   Final    NO GROWTH < 24 HOURS Performed at [facility], [address] [place], [place] [address]    Report Status PENDING  Incomplete  Culture, blood (routine x 2)     Status: None (Preliminary result)   Collection Time: 09/15/24  6:11 AM   Specimen: BLOOD  Result Value Ref Range Status   Specimen Description BLOOD BLOOD RIGHT ARM  Final   Special Requests   Final    BOTTLES  DRAWN AEROBIC AND ANAEROBIC Blood Culture adequate volume   Culture   Final    NO GROWTH < 24 HOURS Performed at [facility], [address] [place] [place], [place] [address]    Report Status PENDING  Incomplete    Labs: CBC: Recent Labs  Lab 09/15/24 0506  WBC 7.6  NEUTROABS 5.4  HGB 11.8*  HCT 35.5*  MCV 92.4  PLT 201   Basic Metabolic Panel: Recent Labs  Lab 09/15/24 0506  NA 139  K 4.1  CL 106  CO2 21*  GLUCOSE 123*  BUN 16  CREATININE 0.92  CALCIUM 8.8*   Liver Function Tests: Recent Labs  Lab 09/15/24 0506  AST 24  ALT 20  ALKPHOS 89   BILITOT 0.5  PROT 6.1*  ALBUMIN 3.6   CBG: Recent Labs  Lab 09/16/24 0518  GLUCAP 108*    Discharge time spent: greater than 30 minutes.  Signed: Leita Blanch, MD [facility] 09/16/2024 "

## 2024-09-20 ENCOUNTER — Telehealth: Payer: Self-pay

## 2024-09-20 LAB — CULTURE, BLOOD (ROUTINE X 2)
Culture: NO GROWTH
Culture: NO GROWTH
Special Requests: ADEQUATE

## 2024-09-20 NOTE — Transitions of Care (Post Inpatient/ED Visit) (Signed)
" ° °  09/20/2024  Name: Katherine Macdonald MRN: [id number] DOB: 1955/12/10  Today's TOC FU Call Status: Today's TOC FU Call Status:: Successful TOC FU Call Completed TOC FU Call Complete Date: 09/20/24  Patient's Name and Date of Birth confirmed. DOB, Name  Transition Care Management Follow-up Telephone Call Date of Discharge: 09/16/24 Discharge Facility: [facility]) Type of Discharge: Inpatient Admission Primary Inpatient Discharge Diagnosis:: syncope How have you been since you were released from the hospital?: Better Any questions or concerns?: No  Items Reviewed: Did you receive and understand the discharge instructions provided?: Yes Medications obtained,verified, and reconciled?: Yes (Medications Reviewed) Any new allergies since your discharge?: No Dietary orders reviewed?: NA Do you have support at home?: Yes  Medications Reviewed Today: Medications Reviewed Today     Reviewed by Lavelle Charmaine NOVAK, LPN (Licensed Practical Nurse) on 09/20/24 at 1544  Med List Status: <None>   Medication Order Taking? Sig Documenting Provider Last Dose Status Informant  traZODone  (DESYREL ) 50 MG tablet [id number] No Take 1-2 tablets (50-100 mg total) by mouth at bedtime as needed. for sleep Tower, Laine LABOR, MD 09/14/2024 Active Self            Home Care and Equipment/Supplies: Were Home Health Services Ordered?: NA Any new equipment or medical supplies ordered?: NA  Functional Questionnaire: Do you need assistance with bathing/showering or dressing?: No Do you need assistance with meal preparation?: No Do you need assistance with eating?: No Do you have difficulty maintaining continence: No Do you need assistance with getting out of bed/getting out of a chair/moving?: No Do you have difficulty managing or taking your medications?: No  Follow up appointments reviewed: PCP Follow-up appointment confirmed?: No (pt. declined will call if appt is  needed) Specialist Hospital Follow-up appointment confirmed?: NA Do you need transportation to your follow-up appointment?: No Do you understand care options if your condition(s) worsen?: Yes-patient verbalized understanding    SIGNATURE Charmaine Lavelle, LPN [facility] Health Advisor Roxie l [facility] You Are. We Are. [facility] Direct Dial [phone]  "

## 2024-09-30 NOTE — Progress Notes (Signed)
 Patient developed persistent borderline hypotension on admission, hypothyroid was suspected and TSH was ordered to rule out severe hypothyroid.

## 2025-06-02 ENCOUNTER — Ambulatory Visit: Admitting: Family Medicine

## 2025-07-14 ENCOUNTER — Ambulatory Visit: Payer: Medicare Other | Admitting: Dermatology
